# Patient Record
Sex: Female | Born: 1940 | Race: White | Hispanic: No
Health system: Southern US, Community
[De-identification: ages and names within clinical notes are randomized; demographics above are authoritative.]

## PROBLEM LIST (undated history)

## (undated) DIAGNOSIS — C801 Malignant (primary) neoplasm, unspecified: Secondary | ICD-10-CM

---

## 1999-07-23 HISTORY — PX: BREAST EXCISIONAL BIOPSY: SUR124

## 2002-03-29 ENCOUNTER — Ambulatory Visit (HOSPITAL_BASED_OUTPATIENT_CLINIC_OR_DEPARTMENT_OTHER): Admission: RE | Admit: 2002-03-29 | Discharge: 2002-03-29 | Payer: Self-pay | Admitting: Oral Surgery

## 2004-06-08 ENCOUNTER — Ambulatory Visit: Payer: Self-pay | Admitting: Family Medicine

## 2005-06-25 ENCOUNTER — Ambulatory Visit: Payer: Self-pay | Admitting: Family Medicine

## 2006-01-08 ENCOUNTER — Ambulatory Visit: Payer: Self-pay | Admitting: General Practice

## 2006-07-03 ENCOUNTER — Ambulatory Visit: Payer: Self-pay | Admitting: Family Medicine

## 2007-07-06 ENCOUNTER — Ambulatory Visit: Payer: Self-pay | Admitting: Family Medicine

## 2008-07-07 ENCOUNTER — Ambulatory Visit: Payer: Self-pay | Admitting: Family Medicine

## 2009-07-13 ENCOUNTER — Ambulatory Visit: Payer: Self-pay | Admitting: Family Medicine

## 2010-07-19 ENCOUNTER — Ambulatory Visit: Payer: Self-pay | Admitting: Family Medicine

## 2010-08-10 ENCOUNTER — Ambulatory Visit: Payer: Self-pay | Admitting: General Practice

## 2010-09-24 ENCOUNTER — Encounter: Payer: Self-pay | Admitting: General Practice

## 2010-10-21 ENCOUNTER — Encounter: Payer: Self-pay | Admitting: General Practice

## 2011-07-23 HISTORY — PX: REDUCTION MAMMAPLASTY: SUR839

## 2011-08-30 ENCOUNTER — Ambulatory Visit: Payer: Self-pay | Admitting: Family Medicine

## 2013-07-08 ENCOUNTER — Ambulatory Visit: Payer: Self-pay | Admitting: Family Medicine

## 2014-07-27 ENCOUNTER — Ambulatory Visit: Payer: Self-pay | Admitting: Family Medicine

## 2014-08-03 DIAGNOSIS — C439 Malignant melanoma of skin, unspecified: Secondary | ICD-10-CM

## 2014-08-03 HISTORY — DX: Malignant melanoma of skin, unspecified: C43.9

## 2016-02-07 ENCOUNTER — Other Ambulatory Visit: Payer: Self-pay | Admitting: Family Medicine

## 2016-02-07 DIAGNOSIS — Z1231 Encounter for screening mammogram for malignant neoplasm of breast: Secondary | ICD-10-CM

## 2016-02-29 ENCOUNTER — Ambulatory Visit: Payer: Self-pay

## 2016-03-28 ENCOUNTER — Ambulatory Visit
Admission: RE | Admit: 2016-03-28 | Discharge: 2016-03-28 | Disposition: A | Discharge status: Home / Self Care | Payer: Medicare Other | Source: Ambulatory Visit | Attending: Family Medicine | Admitting: Family Medicine

## 2016-03-28 DIAGNOSIS — Z1231 Encounter for screening mammogram for malignant neoplasm of breast: Secondary | ICD-10-CM | POA: Diagnosis present

## 2016-03-28 HISTORY — DX: Malignant (primary) neoplasm, unspecified: C80.1

## 2017-07-31 ENCOUNTER — Other Ambulatory Visit: Payer: Self-pay | Admitting: Family Medicine

## 2017-07-31 DIAGNOSIS — Z1231 Encounter for screening mammogram for malignant neoplasm of breast: Secondary | ICD-10-CM

## 2017-08-01 ENCOUNTER — Other Ambulatory Visit: Payer: Self-pay | Admitting: Family Medicine

## 2017-08-01 DIAGNOSIS — Z780 Asymptomatic menopausal state: Secondary | ICD-10-CM

## 2017-08-13 ENCOUNTER — Ambulatory Visit
Admission: RE | Admit: 2017-08-13 | Discharge: 2017-08-13 | Disposition: A | Discharge status: Home / Self Care | Payer: Medicare Other | Source: Ambulatory Visit | Attending: Family Medicine | Admitting: Family Medicine

## 2017-08-13 DIAGNOSIS — Z1231 Encounter for screening mammogram for malignant neoplasm of breast: Secondary | ICD-10-CM

## 2017-08-20 ENCOUNTER — Ambulatory Visit
Admission: RE | Admit: 2017-08-20 | Discharge: 2017-08-20 | Disposition: A | Discharge status: Home / Self Care | Payer: Medicare Other | Source: Ambulatory Visit | Attending: Family Medicine | Admitting: Family Medicine

## 2017-08-20 DIAGNOSIS — Z780 Asymptomatic menopausal state: Secondary | ICD-10-CM | POA: Diagnosis present

## 2017-08-20 DIAGNOSIS — M8588 Other specified disorders of bone density and structure, other site: Secondary | ICD-10-CM | POA: Diagnosis not present

## 2018-03-30 DIAGNOSIS — L570 Actinic keratosis: Secondary | ICD-10-CM

## 2018-03-30 HISTORY — DX: Actinic keratosis: L57.0

## 2019-09-28 ENCOUNTER — Other Ambulatory Visit: Payer: Self-pay

## 2019-09-28 DIAGNOSIS — L400 Psoriasis vulgaris: Secondary | ICD-10-CM

## 2019-10-04 ENCOUNTER — Ambulatory Visit (INDEPENDENT_AMBULATORY_CARE_PROVIDER_SITE_OTHER): Payer: Medicare Other

## 2019-10-04 ENCOUNTER — Other Ambulatory Visit: Payer: Self-pay

## 2019-10-04 DIAGNOSIS — L400 Psoriasis vulgaris: Secondary | ICD-10-CM

## 2019-10-04 NOTE — Progress Notes (Signed)
Total Surface Area: 164cm2 Total Energy: 172.36J

## 2019-10-11 ENCOUNTER — Ambulatory Visit (INDEPENDENT_AMBULATORY_CARE_PROVIDER_SITE_OTHER): Payer: Medicare Other

## 2019-10-11 ENCOUNTER — Other Ambulatory Visit: Payer: Self-pay

## 2019-10-11 DIAGNOSIS — L400 Psoriasis vulgaris: Secondary | ICD-10-CM | POA: Diagnosis not present

## 2019-10-11 NOTE — Progress Notes (Signed)
Total Surface Area: 180cm2 Total Energy: 217.62J

## 2019-11-15 ENCOUNTER — Other Ambulatory Visit: Payer: Self-pay

## 2019-11-15 DIAGNOSIS — Z1231 Encounter for screening mammogram for malignant neoplasm of breast: Secondary | ICD-10-CM

## 2019-11-17 ENCOUNTER — Ambulatory Visit
Admission: RE | Admit: 2019-11-17 | Discharge: 2019-11-17 | Disposition: A | Discharge status: Home / Self Care | Payer: Medicare Other | Source: Ambulatory Visit

## 2019-11-17 DIAGNOSIS — Z1231 Encounter for screening mammogram for malignant neoplasm of breast: Secondary | ICD-10-CM

## 2019-12-24 ENCOUNTER — Other Ambulatory Visit: Payer: Self-pay | Admitting: Dermatology

## 2019-12-24 DIAGNOSIS — L400 Psoriasis vulgaris: Secondary | ICD-10-CM

## 2020-01-17 ENCOUNTER — Encounter: Payer: Self-pay | Admitting: Dermatology

## 2020-01-17 ENCOUNTER — Ambulatory Visit (INDEPENDENT_AMBULATORY_CARE_PROVIDER_SITE_OTHER): Payer: Medicare Other | Admitting: Dermatology

## 2020-01-17 ENCOUNTER — Other Ambulatory Visit: Payer: Self-pay

## 2020-01-17 DIAGNOSIS — L409 Psoriasis, unspecified: Secondary | ICD-10-CM

## 2020-01-17 DIAGNOSIS — D229 Melanocytic nevi, unspecified: Secondary | ICD-10-CM

## 2020-01-17 DIAGNOSIS — L578 Other skin changes due to chronic exposure to nonionizing radiation: Secondary | ICD-10-CM

## 2020-01-17 DIAGNOSIS — Z1283 Encounter for screening for malignant neoplasm of skin: Secondary | ICD-10-CM

## 2020-01-17 DIAGNOSIS — Z85820 Personal history of malignant melanoma of skin: Secondary | ICD-10-CM

## 2020-01-17 DIAGNOSIS — D1800 Hemangioma unspecified site: Secondary | ICD-10-CM

## 2020-01-17 DIAGNOSIS — L821 Other seborrheic keratosis: Secondary | ICD-10-CM

## 2020-01-17 DIAGNOSIS — L814 Other melanin hyperpigmentation: Secondary | ICD-10-CM

## 2020-01-17 MED ORDER — MOMETASONE FUROATE 0.1 % EX SOLN: Freq: Every day | CUTANEOUS | 6 refills | Status: DC

## 2020-01-17 MED ORDER — MOMETASONE FUROATE 0.1 % EX CREA: TOPICAL_CREAM | Freq: Every day | CUTANEOUS | 6 refills | Status: DC

## 2020-01-17 NOTE — Progress Notes (Signed)
   Follow-Up Visit   Subjective  Lori Dalton is a 79 y.o. female who presents for the following: Annual Exam (patient hasn't noticed any new or changing moles, lesions, or spots) and psoriasis (patient's psoriasis continues to flare even with Mometasone solution and cream). The patient presents for Total-Body Skin Exam (TBSE) for skin cancer screening and mole check.  The following portions of the chart were reviewed this encounter and updated as appropriate:  Allergies  Meds  Problems  Med Hx  Surg Hx  Fam Hx      Review of Systems:  No other skin or systemic complaints except as noted in HPI or Assessment and Plan.  Objective  Well appearing patient in no apparent distress; mood and affect are within normal limits.  A full examination was performed including scalp, head, eyes, ears, nose, lips, neck, chest, axillae, abdomen, back, buttocks, bilateral upper extremities, bilateral lower extremities, hands, feet, fingers, toes, fingernails, and toenails. All findings within normal limits unless otherwise noted below.  Objective  B/L lower leg: Plaques and guttate plaques of the lower legs, back, and buttocks. L knee with a 3.0 cm plaque.   Assessment & Plan    Psoriasis B/L lower leg  Continue Mometasone cream and solution to aa's QD-BID PRN flares. ILK injection performed today into the left knee plaque 2.5 mg/mL, TV 0.5 cc, # of injections 6, lot # [id number], Exp. Date 02/2021  Skin cancer screening   Lentigines - Scattered tan macules - Discussed due to sun exposure - Benign, observe - Call for any changes  Seborrheic Keratoses - Stuck-on, waxy, tan-brown papules and plaques  - Discussed benign etiology and prognosis. - Observe - Call for any changes  Melanocytic Nevi - Tan-brown and/or pink-flesh-colored symmetric macules and papules - Benign appearing on exam today - Observation - Call clinic for new or changing moles - Recommend daily use of broad  spectrum spf 30+ sunscreen to sun-exposed areas.   Hemangiomas - Red papules - Discussed benign nature - Observe - Call for any changes  Actinic Damage - diffuse scaly erythematous macules with underlying dyspigmentation - Recommend daily broad spectrum sunscreen SPF 30+ to sun-exposed areas, reapply every 2 hours as needed.  - Call for new or changing lesions.  History of Melanoma - No evidence of recurrence today - No lymphadenopathy - Recommend regular full body skin exams - Recommend daily broad spectrum sunscreen SPF 30+ to sun-exposed areas, reapply every 2 hours as needed.  - Call if any new or changing lesions are noted between office visits   Skin cancer screening performed today.   Return in about 6 months (around 07/18/2020) for follow up psoriasis; TBSE in 1 year.  Luther Redo, CMA, am acting as scribe for Sarina Ser, MD .  Documentation: I have reviewed the above documentation for accuracy and completeness, and I agree with the above.  Sarina Ser, MD

## 2020-01-20 ENCOUNTER — Encounter: Payer: Self-pay | Admitting: Dermatology

## 2020-05-07 IMAGING — MG DIGITAL SCREENING BILAT W/ TOMO W/ CAD
8 series · 9 of 24 positions shown · non-contrast
Comparison: Previous exam(s).

CLINICAL DATA: Screening.

EXAM:
DIGITAL SCREENING BILATERAL MAMMOGRAM WITH TOMO AND CAD

[L CC synth-2D]
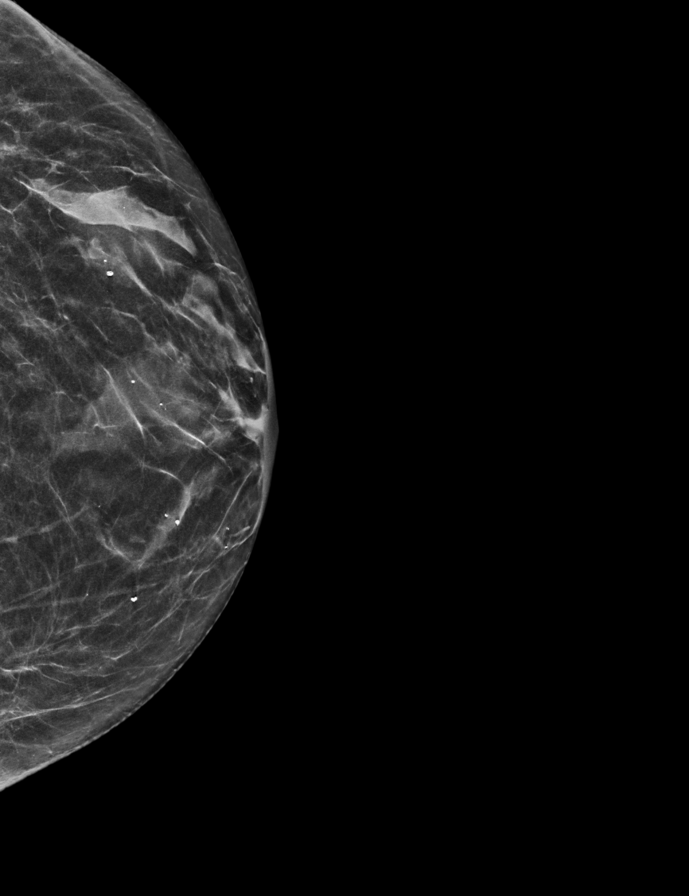

[R CC synth-2D]
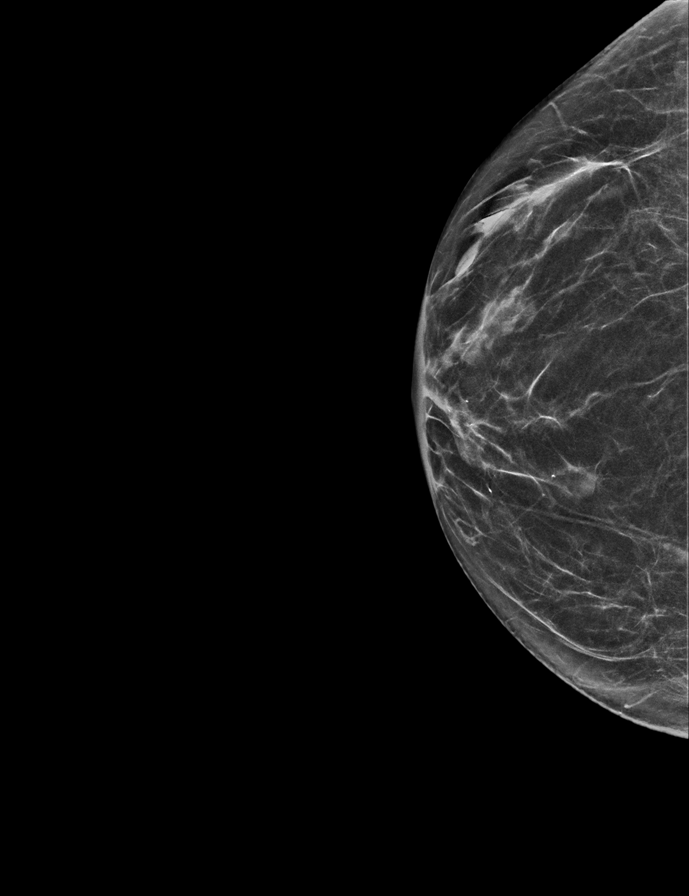

[L MLO synth-2D]
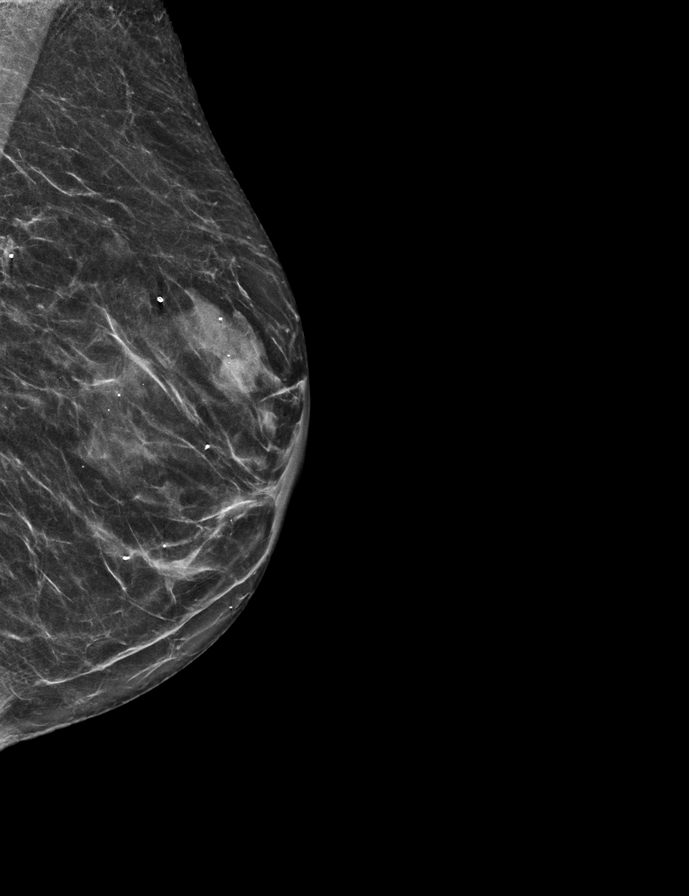

[R MLO synth-2D]
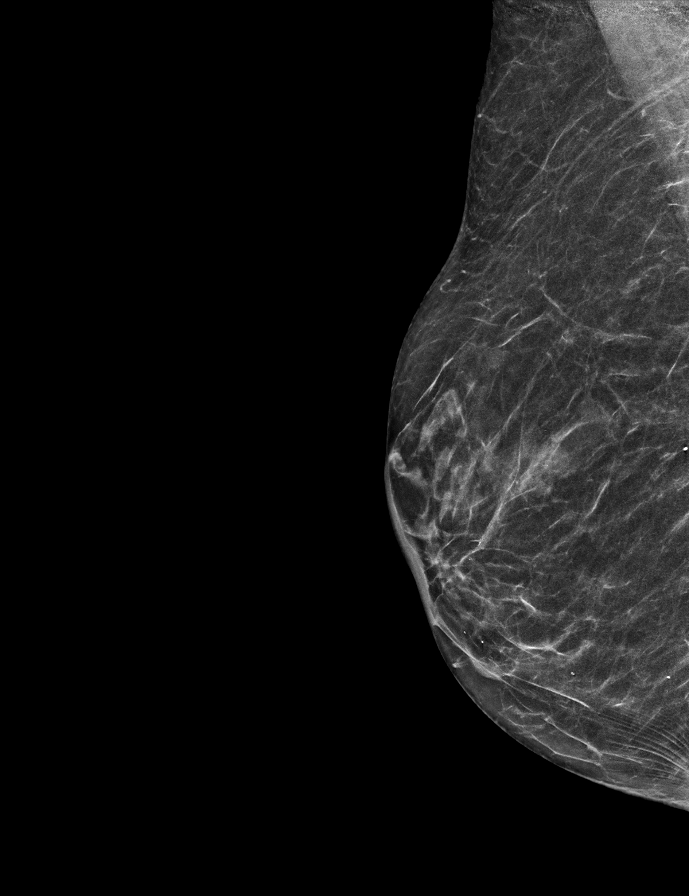

[R CC tomo · 2 of 51 frames shown]
[frame 17/51]
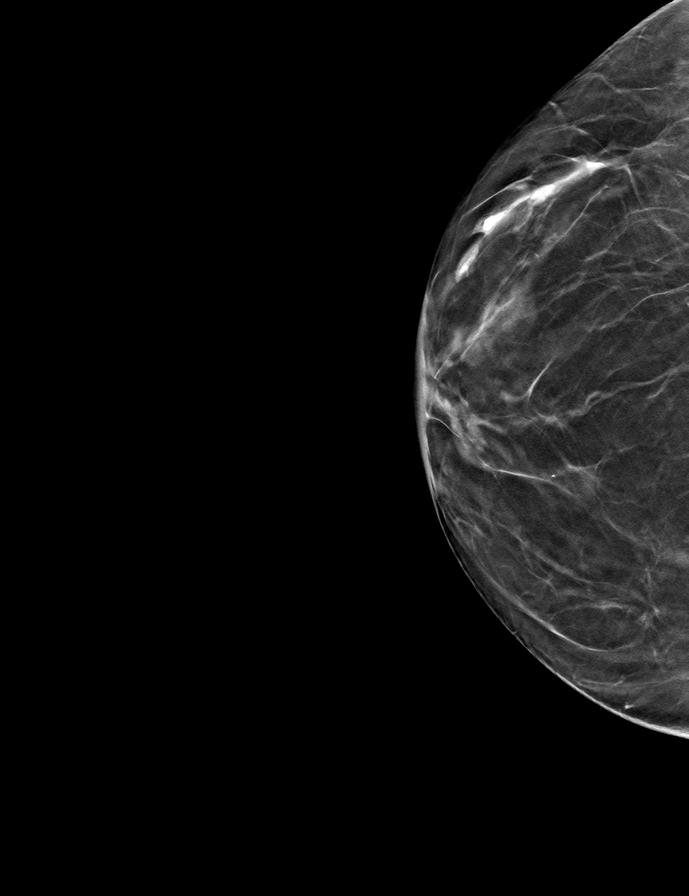
[frame 26/51]
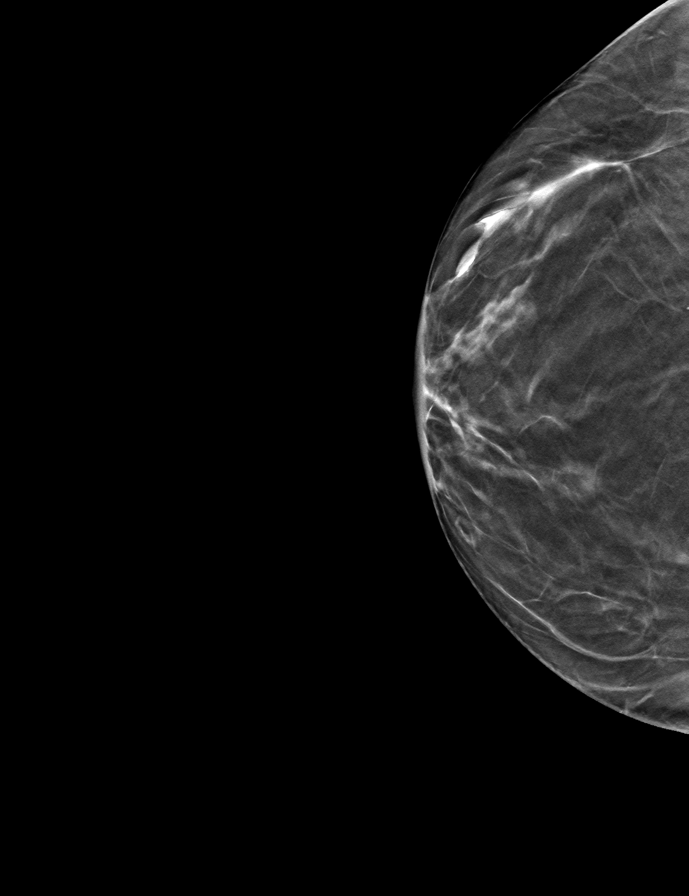

[L CC tomo · tomo slice 26/51.0]
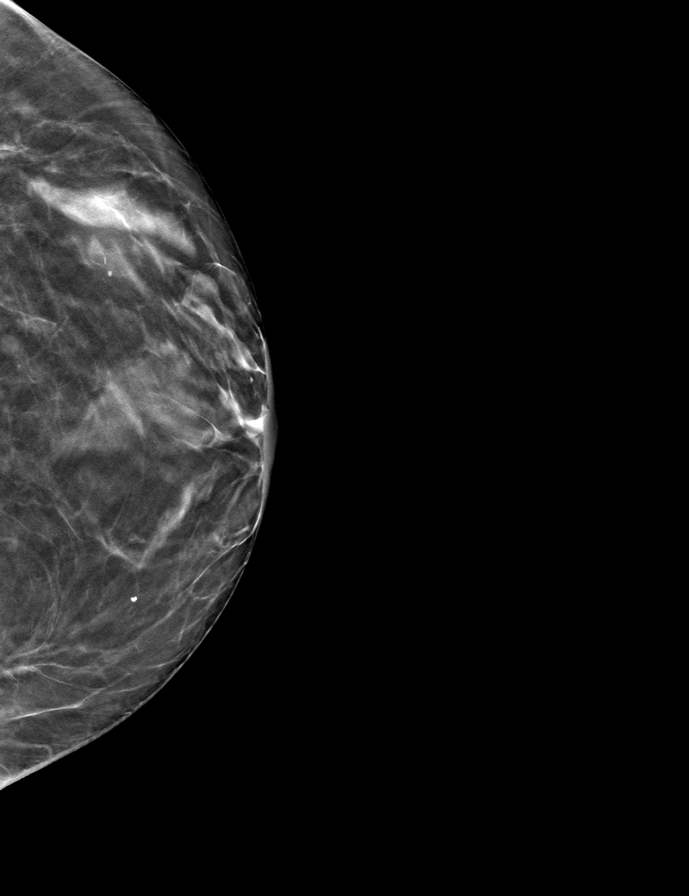

[L MLO tomo · tomo slice 27/54.0]
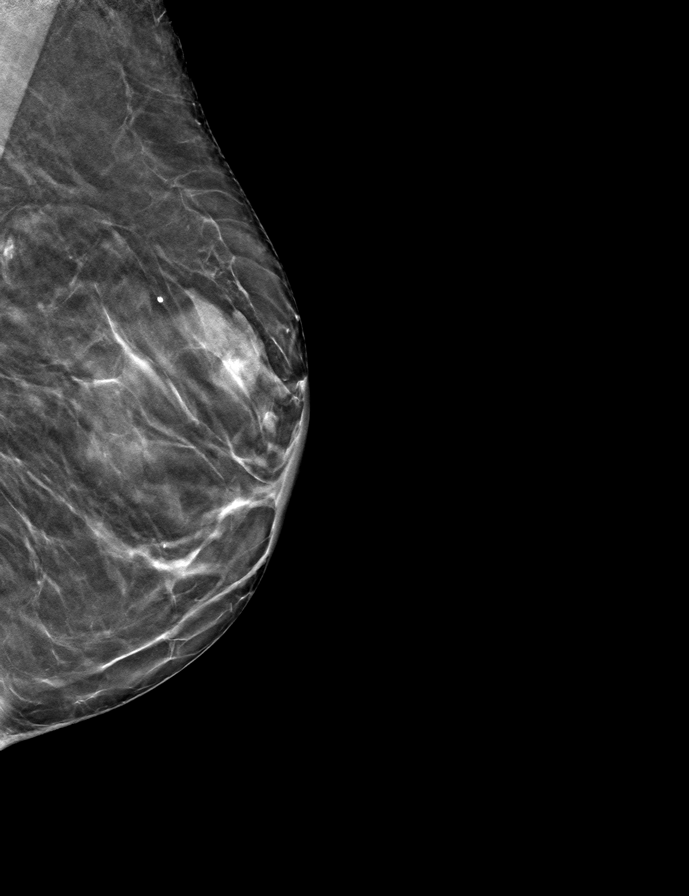

[R MLO tomo · tomo slice 26/51.0]
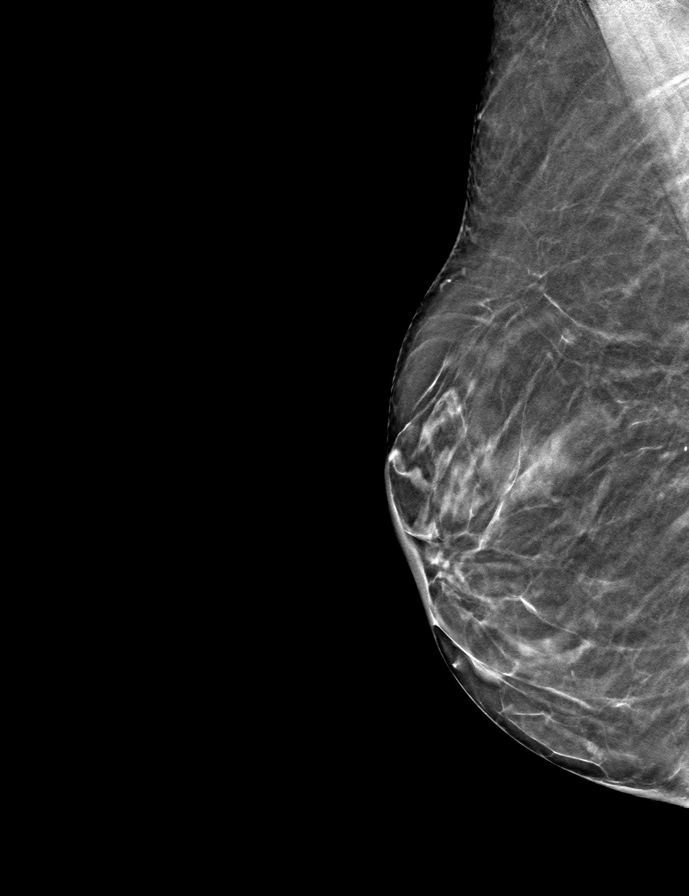

[9 of 24 positions shown; findings below may reference images not displayed]

ACR Breast Density Category c: The breast tissue is heterogeneously
dense, which may obscure small masses.
FINDINGS: There are no findings suspicious for malignancy. Images were
processed with CAD.
IMPRESSION: No mammographic evidence of malignancy. A result letter of this
screening mammogram will be mailed directly to the patient.

RECOMMENDATION:
Screening mammogram in one year. (Code:[id number])

BI-RADS CATEGORY  1: Negative.

## 2020-05-22 ENCOUNTER — Ambulatory Visit (INDEPENDENT_AMBULATORY_CARE_PROVIDER_SITE_OTHER): Payer: Medicare Other

## 2020-05-22 ENCOUNTER — Other Ambulatory Visit: Payer: Self-pay

## 2020-05-22 DIAGNOSIS — L400 Psoriasis vulgaris: Secondary | ICD-10-CM | POA: Diagnosis not present

## 2020-05-22 MED ORDER — CALCIPOTRIENE 0.005 % EX CREA: TOPICAL_CREAM | Freq: Two times a day (BID) | CUTANEOUS | 0 refills | Status: DC

## 2020-05-22 NOTE — Progress Notes (Signed)
Calcipotriene Cream RF.

## 2020-05-22 NOTE — Progress Notes (Signed)
Total Surface Area: 440cm2 Total Energy: 176.00J

## 2020-05-29 ENCOUNTER — Other Ambulatory Visit: Payer: Self-pay

## 2020-05-29 ENCOUNTER — Ambulatory Visit (INDEPENDENT_AMBULATORY_CARE_PROVIDER_SITE_OTHER): Payer: Medicare Other

## 2020-05-29 DIAGNOSIS — L400 Psoriasis vulgaris: Secondary | ICD-10-CM | POA: Diagnosis not present

## 2020-05-29 NOTE — Progress Notes (Signed)
Total Surface Area: 460cm2 Total Energy: 211.60J

## 2020-06-05 ENCOUNTER — Ambulatory Visit (INDEPENDENT_AMBULATORY_CARE_PROVIDER_SITE_OTHER): Payer: Medicare Other

## 2020-06-05 ENCOUNTER — Other Ambulatory Visit: Payer: Self-pay

## 2020-06-05 DIAGNOSIS — L400 Psoriasis vulgaris: Secondary | ICD-10-CM | POA: Diagnosis not present

## 2020-06-05 NOTE — Progress Notes (Signed)
Total Surface Area: 300cm2 Total Energy: 158.70J

## 2020-06-12 ENCOUNTER — Other Ambulatory Visit: Payer: Self-pay

## 2020-06-12 ENCOUNTER — Ambulatory Visit (INDEPENDENT_AMBULATORY_CARE_PROVIDER_SITE_OTHER): Payer: Medicare Other

## 2020-06-12 DIAGNOSIS — L400 Psoriasis vulgaris: Secondary | ICD-10-CM

## 2020-06-12 NOTE — Progress Notes (Signed)
Total Surface Area: 300cm2 Total Energy: 168.08J  Area under breasts also treated with 219mJ/cm2.

## 2020-06-19 ENCOUNTER — Other Ambulatory Visit: Payer: Self-pay

## 2020-06-19 ENCOUNTER — Ambulatory Visit (INDEPENDENT_AMBULATORY_CARE_PROVIDER_SITE_OTHER): Payer: Medicare Other

## 2020-06-19 DIAGNOSIS — L400 Psoriasis vulgaris: Secondary | ICD-10-CM | POA: Diagnosis not present

## 2020-06-19 NOTE — Progress Notes (Signed)
Total Surface Area: 332cm2 Total Energy: 201.86J

## 2020-06-26 ENCOUNTER — Other Ambulatory Visit: Payer: Self-pay

## 2020-06-26 ENCOUNTER — Ambulatory Visit (INDEPENDENT_AMBULATORY_CARE_PROVIDER_SITE_OTHER): Payer: Medicare Other

## 2020-06-26 DIAGNOSIS — L400 Psoriasis vulgaris: Secondary | ICD-10-CM

## 2020-06-26 NOTE — Progress Notes (Signed)
Total Surface Area: 320cm2 Total Energy: 194.56J

## 2020-07-03 ENCOUNTER — Other Ambulatory Visit: Payer: Self-pay

## 2020-07-03 ENCOUNTER — Ambulatory Visit (INDEPENDENT_AMBULATORY_CARE_PROVIDER_SITE_OTHER): Payer: Medicare Other

## 2020-07-03 DIAGNOSIS — L400 Psoriasis vulgaris: Secondary | ICD-10-CM

## 2020-07-03 NOTE — Progress Notes (Signed)
Total Surface Area: 300cm2 Total Energy: 193.73J

## 2020-07-10 ENCOUNTER — Ambulatory Visit (INDEPENDENT_AMBULATORY_CARE_PROVIDER_SITE_OTHER): Payer: Medicare Other

## 2020-07-10 ENCOUNTER — Other Ambulatory Visit: Payer: Self-pay

## 2020-07-10 DIAGNOSIS — L400 Psoriasis vulgaris: Secondary | ICD-10-CM

## 2020-07-10 NOTE — Progress Notes (Signed)
Total Surface Area: 272cm2 Total Energy: 190.13J

## 2020-07-25 ENCOUNTER — Ambulatory Visit: Payer: Medicare Other

## 2020-08-14 ENCOUNTER — Encounter: Payer: Self-pay | Admitting: Dermatology

## 2020-08-14 ENCOUNTER — Other Ambulatory Visit: Payer: Self-pay

## 2020-08-14 ENCOUNTER — Ambulatory Visit (INDEPENDENT_AMBULATORY_CARE_PROVIDER_SITE_OTHER): Payer: Medicare Other | Admitting: Dermatology

## 2020-08-14 ENCOUNTER — Ambulatory Visit (INDEPENDENT_AMBULATORY_CARE_PROVIDER_SITE_OTHER): Payer: Medicare Other

## 2020-08-14 DIAGNOSIS — L400 Psoriasis vulgaris: Secondary | ICD-10-CM | POA: Diagnosis not present

## 2020-08-14 DIAGNOSIS — L578 Other skin changes due to chronic exposure to nonionizing radiation: Secondary | ICD-10-CM

## 2020-08-14 DIAGNOSIS — C44729 Squamous cell carcinoma of skin of left lower limb, including hip: Secondary | ICD-10-CM | POA: Diagnosis not present

## 2020-08-14 DIAGNOSIS — L409 Psoriasis, unspecified: Secondary | ICD-10-CM

## 2020-08-14 DIAGNOSIS — C4492 Squamous cell carcinoma of skin, unspecified: Secondary | ICD-10-CM

## 2020-08-14 DIAGNOSIS — D485 Neoplasm of uncertain behavior of skin: Secondary | ICD-10-CM

## 2020-08-14 HISTORY — DX: Squamous cell carcinoma of skin, unspecified: C44.92

## 2020-08-14 NOTE — Progress Notes (Signed)
Follow-Up Visit   Subjective  Lori Dalton is a 80 y.o. female who presents for the following: Psoriasis (Of the lower legs and trunk - currently using Mometasone solution and cream, as well as Calcipotriene cream QD-BID, but she continues to flare). Patient also has a lesion on her left lower leg that is painful and will not heal. She is unsure whether or not it is a psoriasis plaque.   The following portions of the chart were reviewed this encounter and updated as appropriate:   Allergies  Meds  Problems  Med Hx  Surg Hx  Fam Hx     Review of Systems:  No other skin or systemic complaints except as noted in HPI or Assessment and Plan.  Objective  Well appearing patient in no apparent distress; mood and affect are within normal limits.  A focused examination was performed including the lower legs. Relevant physical exam findings are noted in the Assessment and Plan.  Objective  L mid lat pretibial: 1.5 cm Hyperkeratotic papule   Assessment & Plan  Psoriasis - flared trunk, lower legs  Psoriasis is a chronic non-curable, but treatable genetic/hereditary disease that may have other systemic features affecting other organ systems such as joints (Psoriatic Arthritis). It is associated with an increased risk of inflammatory bowel disease, heart disease, non-alcoholic fatty liver disease, and depression.    Continue Xtrac treatments.  Continue Mometasone cream and solution QD-BID Continue Calcipotriene  0.05% cream to aa's QD-BID   Discussed Otezla. Side effects of Otezla (apremilast) include diarrhea, nausea, headache, upper respiratory infection, depression, and weight decrease (5-10%). It should only be taken by pregnant women after a discussion regarding risks and benefits with their doctor. Goal is control of skin condition, not cure.  The use of [name] requires long term medication management, including periodic office visits.  Start [employer] - Starter pack given patient to  titrate to 30mg  po QD  Neoplasm of uncertain behavior of skin L mid lat pretibial  Epidermal / dermal shaving  Lesion diameter (cm):  1.5 Informed consent: discussed and consent obtained   Timeout: patient name, date of birth, surgical site, and procedure verified   Procedure prep:  Patient was prepped and draped in usual sterile fashion Prep type:  Isopropyl alcohol Anesthesia: the lesion was anesthetized in a standard fashion   Anesthetic:  1% lidocaine w/ epinephrine 1-100,000 buffered w/ 8.4% NaHCO3 Instrument used: flexible razor blade   Hemostasis achieved with: pressure, aluminum chloride and electrodesiccation   Outcome: patient tolerated procedure well   Post-procedure details: sterile dressing applied and wound care instructions given   Dressing type: bandage and petrolatum    Destruction of lesion Complexity: extensive   Destruction method: electrodesiccation and curettage   Informed consent: discussed and consent obtained   Timeout:  patient name, date of birth, surgical site, and procedure verified Procedure prep:  Patient was prepped and draped in usual sterile fashion Prep type:  Isopropyl alcohol Anesthesia: the lesion was anesthetized in a standard fashion   Anesthetic:  1% lidocaine w/ epinephrine 1-100,000 buffered w/ 8.4% NaHCO3 Curettage performed in three different directions: Yes   Electrodesiccation performed over the curetted area: Yes   Lesion length (cm):  1.5 Lesion width (cm):  1.5 Margin per side (cm):  0.2 Final wound size (cm):  1.9 Hemostasis achieved with:  pressure, aluminum chloride and electrodesiccation Outcome: patient tolerated procedure well with no complications   Post-procedure details: sterile dressing applied and wound care instructions given  Dressing type: bandage and petrolatum    Specimen 1 - Surgical pathology Differential Diagnosis: D48.5 r/o SCC  ED&C today  Check Margins: No 1.5 cm Hyperkeratotic papule  Actinic  Damage - chronic, secondary to cumulative UV radiation exposure/sun exposure over time - diffuse scaly erythematous macules with underlying dyspigmentation - Recommend daily broad spectrum sunscreen SPF 30+ to sun-exposed areas, reapply every 2 hours as needed.  - Call for new or changing lesions.  Return in about 1 month (around 09/14/2020) for [place] f/u .  Luther Redo, CMA, am acting as scribe for Sarina Ser, MD .  Documentation: I have reviewed the above documentation for accuracy and completeness, and I agree with the above.  Sarina Ser, MD

## 2020-08-14 NOTE — Patient Instructions (Signed)

## 2020-08-14 NOTE — Progress Notes (Signed)
Total Surface Area: 168cm2 Total Energy: 123.31J

## 2020-08-15 ENCOUNTER — Encounter: Payer: Self-pay | Admitting: Dermatology

## 2020-08-21 ENCOUNTER — Ambulatory Visit (INDEPENDENT_AMBULATORY_CARE_PROVIDER_SITE_OTHER): Payer: Medicare Other

## 2020-08-21 ENCOUNTER — Other Ambulatory Visit: Payer: Self-pay

## 2020-08-21 DIAGNOSIS — L400 Psoriasis vulgaris: Secondary | ICD-10-CM

## 2020-08-21 NOTE — Progress Notes (Signed)
Total Surface Area: 84cm2 Total Energy: 58.55J

## 2020-08-22 ENCOUNTER — Telehealth: Payer: Self-pay

## 2020-08-22 NOTE — Telephone Encounter (Signed)
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

## 2020-08-22 NOTE — Telephone Encounter (Signed)
Went over results with pt at xtrac appt on 08/21/20. She had no concerns.

## 2020-08-28 ENCOUNTER — Ambulatory Visit (INDEPENDENT_AMBULATORY_CARE_PROVIDER_SITE_OTHER): Payer: Medicare Other

## 2020-08-28 ENCOUNTER — Other Ambulatory Visit: Payer: Self-pay

## 2020-08-28 DIAGNOSIS — L400 Psoriasis vulgaris: Secondary | ICD-10-CM | POA: Diagnosis not present

## 2020-08-28 NOTE — Progress Notes (Signed)
Total Surface Area: 136cm2 Total Energy: 94.79J

## 2020-09-11 ENCOUNTER — Other Ambulatory Visit: Payer: Self-pay

## 2020-09-11 ENCOUNTER — Ambulatory Visit (INDEPENDENT_AMBULATORY_CARE_PROVIDER_SITE_OTHER): Payer: Medicare Other

## 2020-09-11 DIAGNOSIS — L400 Psoriasis vulgaris: Secondary | ICD-10-CM | POA: Diagnosis not present

## 2020-09-11 NOTE — Progress Notes (Signed)
Total Surface Area: 64cm2 Total Energy: 44.61J

## 2020-09-18 ENCOUNTER — Ambulatory Visit (INDEPENDENT_AMBULATORY_CARE_PROVIDER_SITE_OTHER): Payer: Medicare Other

## 2020-09-18 ENCOUNTER — Ambulatory Visit (INDEPENDENT_AMBULATORY_CARE_PROVIDER_SITE_OTHER): Payer: Medicare Other | Admitting: Dermatology

## 2020-09-18 ENCOUNTER — Other Ambulatory Visit: Payer: Self-pay

## 2020-09-18 DIAGNOSIS — L578 Other skin changes due to chronic exposure to nonionizing radiation: Secondary | ICD-10-CM

## 2020-09-18 DIAGNOSIS — L400 Psoriasis vulgaris: Secondary | ICD-10-CM

## 2020-09-18 DIAGNOSIS — L409 Psoriasis, unspecified: Secondary | ICD-10-CM

## 2020-09-18 MED ORDER — CALCIPOTRIENE 0.005 % EX CREA: TOPICAL_CREAM | Freq: Two times a day (BID) | CUTANEOUS | 6 refills | Status: DC

## 2020-09-18 MED ORDER — MOMETASONE FUROATE 0.1 % EX SOLN: Freq: Every day | CUTANEOUS | 6 refills | Status: DC

## 2020-09-18 NOTE — Patient Instructions (Signed)
Topical steroids (such as triamcinolone, fluocinolone, fluocinonide, mometasone, clobetasol, halobetasol, betamethasone, hydrocortisone) can cause thinning and lightening of the skin if they are used for too long in the same area. Your physician has selected the right strength medicine for your problem and area affected on the body. Please use your medication only as directed by your physician to prevent side effects.   Side effects of Otezla (apremilast) include diarrhea, nausea, headache, upper respiratory infection, depression, and weight decrease (5-10%). It should only be taken by pregnant women after a discussion regarding risks and benefits with their doctor. Goal is control of skin condition, not cure.  The use of [name] requires long term medication management, including periodic office visits.

## 2020-09-18 NOTE — Progress Notes (Signed)
   Follow-Up Visit   Subjective  Lori Dalton is a 80 y.o. female who presents for the following: Psoriasis (Patient here for follow up on psoriasis. She was given a starter pack for otezla. Patient reports no new outbreaks. She also reports since starting otezla she has been having headaches. She is still using mometasone cream and solution and continues xtrac treatments. ).  The following portions of the chart were reviewed this encounter and updated as appropriate:  Allergies  Meds  Problems  Med Hx  Surg Hx  Fam Hx      Objective  Well appearing patient in no apparent distress; mood and affect are within normal limits.  A focused examination was performed including scalp, legs, trunk, hands, abdomen, bilateral arms. Relevant physical exam findings are noted in the Assessment and Plan.  Objective  bilateral legs and trunk: Well-demarcated erythematous papules/plaques with silvery scale, guttate pink scaly papules.   Assessment & Plan  Psoriasis - persistent despite oral systemic Otezla; X-trac laser and topical Mometasone treatments. bilateral legs and trunk trunk, lower legs Psoriasis is a chronic non-curable, but treatable genetic/hereditary disease that may have other systemic features affecting other organ systems such as joints (Psoriatic Arthritis). It is associated with an increased risk of inflammatory bowel disease, heart disease, non-alcoholic fatty liver disease, and depression.    Patient reports some headaches every 3 or 4 days since starting ortezla.  Will follow up with patient in 6 weeks after patient finishes otezla starter pack will consider starting [employer] given in office   Discussed need for blood work before starting  [employer] pack  Continue Xtrac treatments.  Continue Mometasone cream and solution QD-BID 60 ml 6 rf Continue Calcipotriene  0.05% cream to aa's QD-BID 60 ml 6 rf   Side effects of Otezla (apremilast) include  diarrhea, nausea, headache, upper respiratory infection, depression, and weight decrease (5-10%). It should only be taken by pregnant women after a discussion regarding risks and benefits with their doctor. Goal is control of skin condition, not cure.  The use of [name] requires long term medication management, including periodic office visits.   Reordered Medications calcipotriene (DOVONOX) 0.005 % cream mometasone (ELOCON) 0.1 % lotion  Actinic Damage - chronic, secondary to cumulative UV radiation exposure/sun exposure over time - diffuse scaly erythematous macules with underlying dyspigmentation - Recommend daily broad spectrum sunscreen SPF 30+ to sun-exposed areas, reapply every 2 hours as needed.  - Call for new or changing lesions.  Return in about 6 weeks (around 10/30/2020) for psoriasis follow up .  IRuthell Rummage, CMA, am acting as scribe for Sarina Ser, MD.  Documentation: I have reviewed the above documentation for accuracy and completeness, and I agree with the above.  Sarina Ser, MD

## 2020-09-18 NOTE — Progress Notes (Signed)
Total Surface Area: 188cm2 Total Energy: 150.59J

## 2020-09-19 ENCOUNTER — Encounter: Payer: Self-pay | Admitting: Dermatology

## 2020-09-20 ENCOUNTER — Encounter: Payer: Self-pay | Admitting: Dermatology

## 2020-09-25 ENCOUNTER — Other Ambulatory Visit: Payer: Self-pay

## 2020-09-25 ENCOUNTER — Ambulatory Visit (INDEPENDENT_AMBULATORY_CARE_PROVIDER_SITE_OTHER): Payer: Medicare Other

## 2020-09-25 DIAGNOSIS — L400 Psoriasis vulgaris: Secondary | ICD-10-CM | POA: Diagnosis not present

## 2020-09-25 NOTE — Progress Notes (Signed)
Total Surface Area: 140cm2 Total Energy: 95.20

## 2020-10-09 ENCOUNTER — Ambulatory Visit (INDEPENDENT_AMBULATORY_CARE_PROVIDER_SITE_OTHER): Payer: Medicare Other

## 2020-10-09 ENCOUNTER — Other Ambulatory Visit: Payer: Self-pay

## 2020-10-09 DIAGNOSIS — L400 Psoriasis vulgaris: Secondary | ICD-10-CM

## 2020-10-09 NOTE — Progress Notes (Signed)
Total Surface Area: 196cm2 Total Energy: 133.28J

## 2020-10-16 ENCOUNTER — Other Ambulatory Visit: Payer: Self-pay

## 2020-10-16 ENCOUNTER — Ambulatory Visit: Payer: Medicare Other

## 2020-11-01 ENCOUNTER — Ambulatory Visit (INDEPENDENT_AMBULATORY_CARE_PROVIDER_SITE_OTHER): Payer: Medicare Other | Admitting: Dermatology

## 2020-11-01 ENCOUNTER — Other Ambulatory Visit: Payer: Self-pay

## 2020-11-01 ENCOUNTER — Ambulatory Visit (INDEPENDENT_AMBULATORY_CARE_PROVIDER_SITE_OTHER): Payer: Medicare Other

## 2020-11-01 DIAGNOSIS — L409 Psoriasis, unspecified: Secondary | ICD-10-CM

## 2020-11-01 DIAGNOSIS — D492 Neoplasm of unspecified behavior of bone, soft tissue, and skin: Secondary | ICD-10-CM

## 2020-11-01 DIAGNOSIS — L578 Other skin changes due to chronic exposure to nonionizing radiation: Secondary | ICD-10-CM | POA: Diagnosis not present

## 2020-11-01 DIAGNOSIS — L400 Psoriasis vulgaris: Secondary | ICD-10-CM

## 2020-11-01 DIAGNOSIS — C44729 Squamous cell carcinoma of skin of left lower limb, including hip: Secondary | ICD-10-CM | POA: Diagnosis not present

## 2020-11-01 NOTE — Patient Instructions (Addendum)
If you have any questions or concerns for your doctor, please call our main line at [phone] and press option 4 to reach your doctor's medical assistant. If no one answers, please leave a voicemail as directed and we will return your call as soon as possible. Messages left after 4 pm will be answered the following business day.   You may also send us a message via MyChart. We typically respond to MyChart messages within 1-2 business days.  For prescription refills, please ask your pharmacy to contact our office. Our fax number is [phone].  If you have an urgent issue when the clinic is closed that cannot wait until the next business day, you can page your doctor at the number below.    Please note that while we do our best to be available for urgent issues outside of office hours, we are not available 24/7.   If you have an urgent issue and are unable to reach us, you may choose to seek medical care at your doctor's office, retail clinic, urgent care center, or emergency room.  If you have a medical emergency, please immediately call 911 or go to the emergency department.  Pager Numbers  - Dr. Kowalski: [phone]  - Dr. Moye: [phone]  - Dr. Stewart: [phone]  In the event of inclement weather, please call our main line at [phone] for an update on the status of any delays or closures.  Dermatology Medication Tips: Please keep the boxes that topical medications come in in order to help keep track of the instructions about where and how to use these. Pharmacies typically print the medication instructions only on the boxes and not directly on the medication tubes.   If your medication is too expensive, please contact our office at [phone] option 4 or send us a message through MyChart.   We are unable to tell what your co-pay for medications will be in advance as this is different depending on your insurance coverage. However, we may be able to find a  substitute medication at lower cost or fill out paperwork to get insurance to cover a needed medication.   If a prior authorization is required to get your medication covered by your insurance company, please allow us 1-2 business days to complete this process.  Drug prices often vary depending on where the prescription is filled and some pharmacies may offer cheaper prices.  The website www.goodrx.com contains coupons for medications through different pharmacies. The prices here do not account for what the cost may be with help from insurance (it may be cheaper with your insurance), but the website can give you the price if you did not use any insurance.  - You can print the associated coupon and take it with your prescription to the pharmacy.  - You may also stop by our office during regular business hours and pick up a GoodRx coupon card.  - If you need your prescription sent electronically to a different pharmacy, notify our office through [facility] MyChart or by phone at [phone] option 4.     Wound Care Instructions  1. Cleanse wound gently with soap and water once a day then pat dry with clean gauze. Apply a thing coat of Petrolatum (petroleum jelly, "Vaseline") over the wound (unless you have an allergy to this). We recommend that you use a new, sterile tube of Vaseline. Do not pick or remove scabs. Do not remove the yellow or white "healing tissue" from the base of the wound.    2. Cover the wound with fresh, clean, nonstick gauze and secure with paper tape. You may use Band-Aids in place of gauze and tape if the would is small enough, but would recommend trimming much of the tape off as there is often too much. Sometimes Band-Aids can irritate the skin.  3. You should call the office for your biopsy report after 1 week if you have not already been contacted.  4. If you experience any problems, such as abnormal amounts of bleeding, swelling, significant bruising, significant pain,  or evidence of infection, please call the office immediately.  5. FOR ADULT SURGERY PATIENTS: If you need something for pain relief you may take 1 extra strength Tylenol (acetaminophen) AND 2 Ibuprofen (200mg each) together every 4 hours as needed for pain. (do not take these if you are allergic to them or if you have a reason you should not take them.) Typically, you may only need pain medication for 1 to 3 days.     

## 2020-11-01 NOTE — Progress Notes (Signed)
Follow-Up Visit   Subjective  Lori Dalton is a 80 y.o. female who presents for the following: Psoriasis (Leg, trunk, 6wk f/u improving, Xtrac, Mometasone, pt d/c [place] at last visit) and check new spots (L lower leg, 3wks, painful).  The following portions of the chart were reviewed this encounter and updated as appropriate:   Allergies  Meds  Problems  Med Hx  Surg Hx  Fam Hx     Review of Systems:  No other skin or systemic complaints except as noted in HPI or Assessment and Plan.  Objective  Well appearing patient in no apparent distress; mood and affect are within normal limits.  A focused examination was performed including legs. Relevant physical exam findings are noted in the Assessment and Plan.  Objective  Left lat calf - superior: 1.8 x 1.5cm hyperkeratotic pap  Objective  Left lat calf - inferior: 2.0 x 1.5cm hyperkeratotic pap  Objective  bil lower legs: Scaly pink paps bil lower legs   Assessment & Plan  Neoplasm of skin (2) Left lat calf - superior Epidermal / dermal shaving  Lesion diameter (cm):  1.8 Informed consent: discussed and consent obtained   Timeout: patient name, date of birth, surgical site, and procedure verified   Procedure prep:  Patient was prepped and draped in usual sterile fashion Prep type:  Isopropyl alcohol Anesthesia: the lesion was anesthetized in a standard fashion   Local anesthetic: 0.5% bupivicaine buffered w/ 8.4% NaHC03. Instrument used: flexible razor blade   Hemostasis achieved with: pressure, aluminum chloride and electrodesiccation   Outcome: patient tolerated procedure well   Post-procedure details: sterile dressing applied and wound care instructions given   Dressing type: bandage, bacitracin and pressure dressing    Destruction of lesion Complexity: extensive   Destruction method: electrodesiccation and curettage   Informed consent: discussed and consent obtained   Timeout:  patient name, date of  birth, surgical site, and procedure verified Procedure prep:  Patient was prepped and draped in usual sterile fashion Prep type:  Isopropyl alcohol Anesthesia: the lesion was anesthetized in a standard fashion   Anesthetic:  1% lidocaine w/ epinephrine 1-100,000 buffered w/ 8.4% NaHCO3 (05% bupivicaine buffered w/ 8.4% NaHC03) Curettage performed in three different directions: Yes   Electrodesiccation performed over the curetted area: Yes   Lesion length (cm):  1.8 Lesion width (cm):  1.5 Margin per side (cm):  0.2 Final wound size (cm):  2.2 Hemostasis achieved with:  pressure, aluminum chloride and electrodesiccation Outcome: patient tolerated procedure well with no complications   Post-procedure details: sterile dressing applied and wound care instructions given   Dressing type: bandage, bacitracin and pressure dressing    Specimen 1 - Surgical pathology Differential Diagnosis: D48.5 R/O SCC  Check Margins: No 1.5 x 1.5cm hyperkeratotic pap EDC today  Left lat calf - inferior Epidermal / dermal shaving  Lesion diameter (cm):  2 Informed consent: discussed and consent obtained   Timeout: patient name, date of birth, surgical site, and procedure verified   Procedure prep:  Patient was prepped and draped in usual sterile fashion Prep type:  Isopropyl alcohol Anesthesia: the lesion was anesthetized in a standard fashion   Anesthetic:  1% lidocaine w/ epinephrine 1-100,000 buffered w/ 8.4% NaHCO3 Instrument used: flexible razor blade   Hemostasis achieved with: pressure, aluminum chloride and electrodesiccation   Outcome: patient tolerated procedure well   Post-procedure details: sterile dressing applied and wound care instructions given   Dressing type: bandage, bacitracin and pressure dressing  Destruction of lesion Complexity: extensive   Destruction method: electrodesiccation and curettage   Informed consent: discussed and consent obtained   Timeout:  patient name, date  of birth, surgical site, and procedure verified Procedure prep:  Patient was prepped and draped in usual sterile fashion Prep type:  Isopropyl alcohol Anesthesia: the lesion was anesthetized in a standard fashion   Anesthetic:  1% lidocaine w/ epinephrine 1-100,000 buffered w/ 8.4% NaHCO3 Curettage performed in three different directions: Yes   Electrodesiccation performed over the curetted area: Yes   Lesion length (cm):  2 Lesion width (cm):  1.5 Margin per side (cm):  0.2 Final wound size (cm):  2.4 Hemostasis achieved with:  pressure, aluminum chloride and electrodesiccation Outcome: patient tolerated procedure well with no complications   Post-procedure details: sterile dressing applied and wound care instructions given   Dressing type: bandage and bacitracin    Specimen 2 - Surgical pathology Differential Diagnosis: D48.5 R/O SCC  Check Margins: No 2.0 x 1.5cm hyperkeratotic pap EDC today  Psoriasis bil lower legs Psoriasis is a chronic non-curable, but treatable genetic/hereditary disease that may have other systemic features affecting other organ systems such as joints (Psoriatic Arthritis). It is associated with an increased risk of inflammatory bowel disease, heart disease, non-alcoholic fatty liver disease, and depression.    Pt previously tried [place] and had headaches and felt psoriasis worsened. Discussed Skyri[place], pt declines  Cont Mometasone lotion qd prn flares Restart Calcipotriene cr qd prn flares  Restart Xtrac  Other Related Medications calcipotriene (DOVONOX) 0.005 % cream mometasone (ELOCON) 0.1 % lotion  Actinic Damage - chronic, secondary to cumulative UV radiation exposure/sun exposure over time - diffuse scaly erythematous macules with underlying dyspigmentation - Recommend daily broad spectrum sunscreen SPF 30+ to sun-exposed areas, reapply every 2 hours as needed.  - Recommend staying in the shade or wearing long sleeves, sun glasses (UVA+UVB  protection) and wide brim hats (4-inch brim around the entire circumference of the hat). - Call for new or changing lesions.  Return in about 6 months (around 05/03/2021) for Psoriasis f/u, Xtrac 2-3x/wk.  I, Othelia Pulling, RMA, am acting as scribe for Sarina Ser, MD .  Documentation: I have reviewed the above documentation for accuracy and completeness, and I agree with the above.  Sarina Ser, MD

## 2020-11-01 NOTE — Progress Notes (Signed)
Total Surface Area: 128cm2 Total Energy: 38.40J

## 2020-11-02 ENCOUNTER — Encounter: Payer: Self-pay | Admitting: Dermatology

## 2020-11-06 ENCOUNTER — Telehealth: Payer: Self-pay

## 2020-11-06 NOTE — Telephone Encounter (Signed)
Left message on voicemail to return my call.  

## 2020-11-06 NOTE — Telephone Encounter (Signed)
-----   Message from Ralene Bathe, MD sent at 11/02/2020  6:22 PM EDT ----- Diagnosis 1. 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

## 2020-11-24 ENCOUNTER — Other Ambulatory Visit: Payer: Self-pay

## 2020-11-24 DIAGNOSIS — Z1231 Encounter for screening mammogram for malignant neoplasm of breast: Secondary | ICD-10-CM

## 2020-12-01 ENCOUNTER — Other Ambulatory Visit: Payer: Self-pay

## 2020-12-01 ENCOUNTER — Ambulatory Visit
Admission: RE | Admit: 2020-12-01 | Discharge: 2020-12-01 | Disposition: A | Discharge status: Home / Self Care | Payer: Medicare Other | Source: Ambulatory Visit

## 2020-12-01 DIAGNOSIS — Z1231 Encounter for screening mammogram for malignant neoplasm of breast: Secondary | ICD-10-CM | POA: Diagnosis not present

## 2021-01-23 ENCOUNTER — Ambulatory Visit (INDEPENDENT_AMBULATORY_CARE_PROVIDER_SITE_OTHER): Payer: Medicare Other

## 2021-01-23 DIAGNOSIS — L400 Psoriasis vulgaris: Secondary | ICD-10-CM | POA: Diagnosis not present

## 2021-01-23 NOTE — Progress Notes (Signed)
Total Surface Area: 124cm2 Total Energy: 37.20J

## 2021-02-21 ENCOUNTER — Other Ambulatory Visit: Payer: Self-pay

## 2021-02-21 ENCOUNTER — Ambulatory Visit (INDEPENDENT_AMBULATORY_CARE_PROVIDER_SITE_OTHER): Payer: Medicare Other | Admitting: Dermatology

## 2021-02-21 ENCOUNTER — Encounter: Payer: Self-pay | Admitting: Dermatology

## 2021-02-21 ENCOUNTER — Ambulatory Visit (INDEPENDENT_AMBULATORY_CARE_PROVIDER_SITE_OTHER): Payer: Medicare Other

## 2021-02-21 ENCOUNTER — Telehealth: Payer: Self-pay

## 2021-02-21 DIAGNOSIS — D492 Neoplasm of unspecified behavior of bone, soft tissue, and skin: Secondary | ICD-10-CM

## 2021-02-21 DIAGNOSIS — L409 Psoriasis, unspecified: Secondary | ICD-10-CM | POA: Diagnosis not present

## 2021-02-21 DIAGNOSIS — C44729 Squamous cell carcinoma of skin of left lower limb, including hip: Secondary | ICD-10-CM

## 2021-02-21 DIAGNOSIS — L820 Inflamed seborrheic keratosis: Secondary | ICD-10-CM | POA: Diagnosis not present

## 2021-02-21 DIAGNOSIS — L400 Psoriasis vulgaris: Secondary | ICD-10-CM | POA: Diagnosis not present

## 2021-02-21 MED ORDER — VTAMA 1 % EX CREA
1.0000 | TOPICAL_CREAM | Freq: Every day | CUTANEOUS | 4 refills | Status: DC
Start: 2021-02-21 — End: 2021-12-10

## 2021-02-21 NOTE — Progress Notes (Signed)
Follow-Up Visit   Subjective  Lori Dalton is a 80 y.o. female who presents for the following: check spots legs (Bil legs, scaly). Patient has history of skin cancer.  She also has psoriasis currently using XTRAC laser and using topical treatment.  The following portions of the chart were reviewed this encounter and updated as appropriate:   Allergies  Meds  Problems  Med Hx  Surg Hx  Fam Hx      Review of Systems:  No other skin or systemic complaints except as noted in HPI or Assessment and Plan.  Objective  Well appearing patient in no apparent distress; mood and affect are within normal limits.  A focused examination was performed including bil legs. Relevant physical exam findings are noted in the Assessment and Plan.  L prox lat calf Hyperkeratotic pap 1.5cm  bil lower legs x 3, Total = 3 Erythematous keratotic or waxy stuck-on papule or plaque.   bil legs Guttate patches legs   Assessment & Plan  Neoplasm of skin L prox lat calf  Epidermal / dermal shaving  Lesion diameter (cm):  1.5 Informed consent: discussed and consent obtained   Timeout: patient name, date of birth, surgical site, and procedure verified   Procedure prep:  Patient was prepped and draped in usual sterile fashion Prep type:  Isopropyl alcohol Anesthesia: the lesion was anesthetized in a standard fashion   Anesthetic:  1% lidocaine w/ epinephrine 1-100,000 buffered w/ 8.4% NaHCO3 Instrument used: flexible razor blade   Hemostasis achieved with: pressure, aluminum chloride and electrodesiccation   Outcome: patient tolerated procedure well   Post-procedure details: sterile dressing applied and wound care instructions given   Dressing type: bandage and bacitracin    Destruction of lesion Complexity: extensive   Destruction method: electrodesiccation and curettage   Informed consent: discussed and consent obtained   Timeout:  patient name, date of birth, surgical site, and procedure  verified Procedure prep:  Patient was prepped and draped in usual sterile fashion Prep type:  Isopropyl alcohol Anesthesia: the lesion was anesthetized in a standard fashion   Anesthetic:  1% lidocaine w/ epinephrine 1-100,000 buffered w/ 8.4% NaHCO3 Curettage performed in three different directions: Yes   Electrodesiccation performed over the curetted area: Yes   Lesion length (cm):  1.5 Lesion width (cm):  1.5 Margin per side (cm):  0.2 Final wound size (cm):  1.9 Hemostasis achieved with:  pressure, aluminum chloride and electrodesiccation Outcome: patient tolerated procedure well with no complications   Post-procedure details: sterile dressing applied and wound care instructions given   Dressing type: bandage and bacitracin    Specimen 1 - Surgical pathology Differential Diagnosis: D48.5 R/O SCC  Check Margins: No Hyperkeratotic pap 1.5cm EDC today  Inflamed seborrheic keratosis bil lower legs x 3, Total = 3  Destruction of lesion - bil lower legs x 3, Total = 3 Complexity: simple   Destruction method: cryotherapy   Informed consent: discussed and consent obtained   Timeout:  patient name, date of birth, surgical site, and procedure verified Lesion destroyed using liquid nitrogen: Yes   Region frozen until ice ball extended beyond lesion: Yes   Outcome: patient tolerated procedure well with no complications   Post-procedure details: wound care instructions given    Psoriasis bil legs  Psoriasis is a chronic non-curable, but treatable genetic/hereditary disease that may have other systemic features affecting other organ systems such as joints (Psoriatic Arthritis). It is associated with an increased risk of inflammatory bowel disease,  heart disease, non-alcoholic fatty liver disease, and depression.     Start Vtama cr qhs, samples x 2 Lot [id number] exp 10/23 Cont Xtrac Laser  Tapinarof (VTAMA) 1 % CREA - bil legs Apply 1 application topically daily. Qd to aa psoriasis on  body  Related Medications calcipotriene (DOVONOX) 0.005 % cream Apply topically 2 (two) times daily. Apply to affected areas of legs  mometasone (ELOCON) 0.1 % lotion Apply topically daily. Use up to 5 days a week  Return for as scheduled for 70mf/u.  I, SOthelia Pulling RMA, am acting as scribe for DSarina Ser MD . Documentation: I have reviewed the above documentation for accuracy and completeness, and I agree with the above.  DSarina Ser MD

## 2021-02-21 NOTE — Patient Instructions (Signed)
Wound Care Instructions  Cleanse wound gently with soap and water once a day then pat dry with clean gauze. Apply a thing coat of Petrolatum (petroleum jelly, "Vaseline") over the wound (unless you have an allergy to this). We recommend that you use a new, sterile tube of Vaseline. Do not pick or remove scabs. Do not remove the yellow or white "healing tissue" from the base of the wound.  Cover the wound with fresh, clean, nonstick gauze and secure with paper tape. You may use Band-Aids in place of gauze and tape if the would is small enough, but would recommend trimming much of the tape off as there is often too much. Sometimes Band-Aids can irritate the skin.  You should call the office for your biopsy report after 1 week if you have not already been contacted.  If you experience any problems, such as abnormal amounts of bleeding, swelling, significant bruising, significant pain, or evidence of infection, please call the office immediately.  FOR ADULT SURGERY PATIENTS: If you need something for pain relief you may take 1 extra strength Tylenol (acetaminophen) AND 2 Ibuprofen (200mg each) together every 4 hours as needed for pain. (do not take these if you are allergic to them or if you have a reason you should not take them.) Typically, you may only need pain medication for 1 to 3 days.   If you have any questions or concerns for your doctor, please call our main line at [phone] and press option 4 to reach your doctor's medical assistant. If no one answers, please leave a voicemail as directed and we will return your call as soon as possible. Messages left after 4 pm will be answered the following business day.   You may also send us a message via MyChart. We typically respond to MyChart messages within 1-2 business days.  For prescription refills, please ask your pharmacy to contact our office. Our fax number is [phone].  If you have an urgent issue when the clinic is closed that  cannot wait until the next business day, you can page your doctor at the number below.    Please note that while we do our best to be available for urgent issues outside of office hours, we are not available 24/7.   If you have an urgent issue and are unable to reach us, you may choose to seek medical care at your doctor's office, retail clinic, urgent care center, or emergency room.  If you have a medical emergency, please immediately call 911 or go to the emergency department.  Pager Numbers  - Dr. Kowalski: [phone]  - Dr. Moye: [phone]  - Dr. Stewart: [phone]  In the event of inclement weather, please call our main line at [phone] for an update on the status of any delays or closures.  Dermatology Medication Tips: Please keep the boxes that topical medications come in in order to help keep track of the instructions about where and how to use these. Pharmacies typically print the medication instructions only on the boxes and not directly on the medication tubes.   If your medication is too expensive, please contact our office at [phone] option 4 or send us a message through MyChart.   We are unable to tell what your co-pay for medications will be in advance as this is different depending on your insurance coverage. However, we may be able to find a substitute medication at lower cost or fill out paperwork to get insurance to cover a needed   medication.   If a prior authorization is required to get your medication covered by your insurance company, please allow us 1-2 business days to complete this process.  Drug prices often vary depending on where the prescription is filled and some pharmacies may offer cheaper prices.  The website www.goodrx.com contains coupons for medications through different pharmacies. The prices here do not account for what the cost may be with help from insurance (it may be cheaper with your insurance), but the website can give you the  price if you did not use any insurance.  - You can print the associated coupon and take it with your prescription to the pharmacy.  - You may also stop by our office during regular business hours and pick up a GoodRx coupon card.  - If you need your prescription sent electronically to a different pharmacy, notify our office through [facility] MyChart or by phone at [phone] option 4.   

## 2021-02-21 NOTE — Progress Notes (Signed)
Total Surface Area: 260cm2 Total Energy: 99.20J  Also treated under patient's breast at 263m.

## 2021-02-21 NOTE — Telephone Encounter (Signed)
Medicare patient and Vtama not covered. Will cost over $1,000.

## 2021-02-22 NOTE — Telephone Encounter (Signed)
Patient advised. aw 

## 2021-02-27 ENCOUNTER — Telehealth: Payer: Self-pay

## 2021-02-27 NOTE — Telephone Encounter (Signed)
Spoke with pt and informed her of results. She had no concerns.

## 2021-02-27 NOTE — Telephone Encounter (Signed)
-----   Message from Ralene Bathe, MD sent at 02/27/2021 11:15 AM EDT ----- Diagnosis Skin , left proximal lateral calf WELL DIFFERENTIATED SQUAMOUS CELL CARCINOMA, BASE INVOLVED  Cancer - SCC Already treated Recheck next visit

## 2021-02-27 NOTE — Telephone Encounter (Signed)
Left message on voicemail to return my call.  

## 2021-04-09 ENCOUNTER — Other Ambulatory Visit: Payer: Self-pay

## 2021-04-09 ENCOUNTER — Ambulatory Visit (INDEPENDENT_AMBULATORY_CARE_PROVIDER_SITE_OTHER): Payer: Medicare Other

## 2021-04-09 DIAGNOSIS — L400 Psoriasis vulgaris: Secondary | ICD-10-CM

## 2021-04-09 NOTE — Progress Notes (Signed)
Total Surface Area: 384cm2 Total Energy: 176.64J

## 2021-04-16 ENCOUNTER — Ambulatory Visit (INDEPENDENT_AMBULATORY_CARE_PROVIDER_SITE_OTHER): Payer: Medicare Other

## 2021-04-16 ENCOUNTER — Other Ambulatory Visit: Payer: Self-pay

## 2021-04-16 DIAGNOSIS — L400 Psoriasis vulgaris: Secondary | ICD-10-CM

## 2021-04-16 NOTE — Progress Notes (Signed)
Total Surface Area: 364cm2 Total Energy: 192.56J

## 2021-04-23 ENCOUNTER — Ambulatory Visit (INDEPENDENT_AMBULATORY_CARE_PROVIDER_SITE_OTHER): Payer: Medicare Other

## 2021-04-23 ENCOUNTER — Other Ambulatory Visit: Payer: Self-pay

## 2021-04-23 DIAGNOSIS — L400 Psoriasis vulgaris: Secondary | ICD-10-CM

## 2021-04-23 NOTE — Progress Notes (Signed)
Total Surface Area: 336 cm2 Total Energy: 204.29 J

## 2021-04-30 ENCOUNTER — Ambulatory Visit (INDEPENDENT_AMBULATORY_CARE_PROVIDER_SITE_OTHER): Payer: Medicare Other | Admitting: Dermatology

## 2021-04-30 ENCOUNTER — Other Ambulatory Visit: Payer: Self-pay

## 2021-04-30 ENCOUNTER — Ambulatory Visit (INDEPENDENT_AMBULATORY_CARE_PROVIDER_SITE_OTHER): Payer: Medicare Other

## 2021-04-30 DIAGNOSIS — L820 Inflamed seborrheic keratosis: Secondary | ICD-10-CM | POA: Diagnosis not present

## 2021-04-30 DIAGNOSIS — L570 Actinic keratosis: Secondary | ICD-10-CM

## 2021-04-30 DIAGNOSIS — C44722 Squamous cell carcinoma of skin of right lower limb, including hip: Secondary | ICD-10-CM | POA: Diagnosis not present

## 2021-04-30 DIAGNOSIS — L400 Psoriasis vulgaris: Secondary | ICD-10-CM

## 2021-04-30 DIAGNOSIS — Z85828 Personal history of other malignant neoplasm of skin: Secondary | ICD-10-CM

## 2021-04-30 DIAGNOSIS — L409 Psoriasis, unspecified: Secondary | ICD-10-CM | POA: Diagnosis not present

## 2021-04-30 DIAGNOSIS — D485 Neoplasm of uncertain behavior of skin: Secondary | ICD-10-CM

## 2021-04-30 DIAGNOSIS — L578 Other skin changes due to chronic exposure to nonionizing radiation: Secondary | ICD-10-CM

## 2021-04-30 NOTE — Progress Notes (Signed)
Total Surface Area: 128cm2 Total Energy: 89.47J

## 2021-04-30 NOTE — Patient Instructions (Addendum)
If you have any questions or concerns for your doctor, please call our main line at [phone] and press option 4 to reach your doctor's medical assistant. If no one answers, please leave a voicemail as directed and we will return your call as soon as possible. Messages left after 4 pm will be answered the following business day.   You may also send us a message via MyChart. We typically respond to MyChart messages within 1-2 business days.  For prescription refills, please ask your pharmacy to contact our office. Our fax number is [phone].  If you have an urgent issue when the clinic is closed that cannot wait until the next business day, you can page your doctor at the number below.    Please note that while we do our best to be available for urgent issues outside of office hours, we are not available 24/7.   If you have an urgent issue and are unable to reach us, you may choose to seek medical care at your doctor's office, retail clinic, urgent care center, or emergency room.  If you have a medical emergency, please immediately call 911 or go to the emergency department.  Pager Numbers  - Dr. Kowalski: [phone]  - Dr. Moye: [phone]  - Dr. Stewart: [phone]  In the event of inclement weather, please call our main line at [phone] for an update on the status of any delays or closures.  Dermatology Medication Tips: Please keep the boxes that topical medications come in in order to help keep track of the instructions about where and how to use these. Pharmacies typically print the medication instructions only on the boxes and not directly on the medication tubes.   If your medication is too expensive, please contact our office at [phone] option 4 or send us a message through MyChart.   We are unable to tell what your co-pay for medications will be in advance as this is different depending on your insurance coverage. However, we may be able to find a substitute  medication at lower cost or fill out paperwork to get insurance to cover a needed medication.   If a prior authorization is required to get your medication covered by your insurance company, please allow us 1-2 business days to complete this process.  Drug prices often vary depending on where the prescription is filled and some pharmacies may offer cheaper prices.  The website www.goodrx.com contains coupons for medications through different pharmacies. The prices here do not account for what the cost may be with help from insurance (it may be cheaper with your insurance), but the website can give you the price if you did not use any insurance.  - You can print the associated coupon and take it with your prescription to the pharmacy.  - You may also stop by our office during regular business hours and pick up a GoodRx coupon card.  - If you need your prescription sent electronically to a different pharmacy, notify our office through [facility] MyChart or by phone at [phone] option 4.   Wound Care Instructions  Cleanse wound gently with soap and water once a day then pat dry with clean gauze. Apply a thing coat of Petrolatum (petroleum jelly, "Vaseline") over the wound (unless you have an allergy to this). We recommend that you use a new, sterile tube of Vaseline. Do not pick or remove scabs. Do not remove the yellow or white "healing tissue" from the base of the wound.  Cover the   wound with fresh, clean, nonstick gauze and secure with paper tape. You may use Band-Aids in place of gauze and tape if the would is small enough, but would recommend trimming much of the tape off as there is often too much. Sometimes Band-Aids can irritate the skin.  You should call the office for your biopsy report after 1 week if you have not already been contacted.  If you experience any problems, such as abnormal amounts of bleeding, swelling, significant bruising, significant pain, or evidence of infection,  please call the office immediately.  FOR ADULT SURGERY PATIENTS: If you need something for pain relief you may take 1 extra strength Tylenol (acetaminophen) AND 2 Ibuprofen (200mg each) together every 4 hours as needed for pain. (do not take these if you are allergic to them or if you have a reason you should not take them.) Typically, you may only need pain medication for 1 to 3 days.    

## 2021-04-30 NOTE — Progress Notes (Signed)
Follow-Up Visit   Subjective  Lori Dalton is a 80 y.o. female who presents for the following: irregular skin lesions (B/L lower leg - patient has a hx of SCC and would like lesions checked today).  The following portions of the chart were reviewed this encounter and updated as appropriate:   Allergies  Meds  Problems  Med Hx  Surg Hx  Fam Hx     Review of Systems:  No other skin or systemic complaints except as noted in HPI or Assessment and Plan.  Objective  Well appearing patient in no apparent distress; mood and affect are within normal limits.  A focused examination was performed including B/L leg. Relevant physical exam findings are noted in the Assessment and Plan.  B/L leg, trunk, extremities Well-demarcated erythematous papules/plaques with silvery scale, guttate pink scaly papules.   B/L leg x 15 (15) Erythematous keratotic or waxy stuck-on papule or plaque.   B/L leg x 10 (10) Erythematous thin papules/macules with gritty scale.   R mid lat calf Hyperkeratotic papule 1.2 cm    Assessment & Plan  Psoriasis - flared B/L leg, trunk, extremities  Psoriasis is a chronic non-curable, but treatable genetic/hereditary disease that may have other systemic features affecting other organ systems such as joints (Psoriatic Arthritis). It is associated with an increased risk of inflammatory bowel disease, heart disease, non-alcoholic fatty liver disease, and depression.    Continue Mometasone 0.1% to aa's QD PRN.  Continue Calcipotriene cream to aa's QD PRN.  Continue Xtrac Laser Pt did not tolerate Otezla. She does not want to consider injectable biologics. May discuss SOTIKTU at fu appt.  Topical steroids (such as triamcinolone, fluocinolone, fluocinonide, mometasone, clobetasol, halobetasol, betamethasone, hydrocortisone) can cause thinning and lightening of the skin if they are used for too long in the same area. Your physician has selected the right strength  medicine for your problem and area affected on the body. Please use your medication only as directed by your physician to prevent side effects.   Related Medications calcipotriene (DOVONOX) 0.005 % cream Apply topically 2 (two) times daily. Apply to affected areas of legs  mometasone (ELOCON) 0.1 % lotion Apply topically daily. Use up to 5 days a week  Tapinarof (VTAMA) 1 % CREA Apply 1 application topically daily. Qd to aa psoriasis on body  Inflamed seborrheic keratosis B/L leg x 15 Destruction of lesion - B/L leg x 15 Complexity: simple   Destruction method: cryotherapy   Informed consent: discussed and consent obtained   Timeout:  patient name, date of birth, surgical site, and procedure verified Lesion destroyed using liquid nitrogen: Yes   Region frozen until ice ball extended beyond lesion: Yes   Outcome: patient tolerated procedure well with no complications   Post-procedure details: wound care instructions given    AK (actinic keratosis) (10) B/L leg x 10 Destruction of lesion - B/L leg x 10 Complexity: simple   Destruction method: cryotherapy   Informed consent: discussed and consent obtained   Timeout:  patient name, date of birth, surgical site, and procedure verified Lesion destroyed using liquid nitrogen: Yes   Region frozen until ice ball extended beyond lesion: Yes   Outcome: patient tolerated procedure well with no complications   Post-procedure details: wound care instructions given    Neoplasm of uncertain behavior of skin R mid lat calf Epidermal / dermal shaving  Lesion diameter (cm):  1.2 Informed consent: discussed and consent obtained   Timeout: patient name, date of birth,  surgical site, and procedure verified   Procedure prep:  Patient was prepped and draped in usual sterile fashion Prep type:  Isopropyl alcohol Anesthesia: the lesion was anesthetized in a standard fashion   Anesthetic:  1% lidocaine w/ epinephrine 1-100,000 buffered w/ 8.4%  NaHCO3 Instrument used: flexible razor blade   Hemostasis achieved with: pressure, aluminum chloride and electrodesiccation   Outcome: patient tolerated procedure well   Post-procedure details: sterile dressing applied and wound care instructions given   Dressing type: bandage and petrolatum    Destruction of lesion Complexity: extensive   Destruction method: electrodesiccation and curettage   Informed consent: discussed and consent obtained   Timeout:  patient name, date of birth, surgical site, and procedure verified Procedure prep:  Patient was prepped and draped in usual sterile fashion Prep type:  Isopropyl alcohol Anesthesia: the lesion was anesthetized in a standard fashion   Anesthetic:  1% lidocaine w/ epinephrine 1-100,000 buffered w/ 8.4% NaHCO3 Curettage performed in three different directions: Yes   Electrodesiccation performed over the curetted area: Yes   Lesion length (cm):  1.2 Lesion width (cm):  1.2 Margin per side (cm):  0.2 Final wound size (cm):  1.6 Hemostasis achieved with:  pressure, aluminum chloride and electrodesiccation Outcome: patient tolerated procedure well with no complications   Post-procedure details: sterile dressing applied and wound care instructions given   Dressing type: bandage and petrolatum    Specimen 1 - Surgical pathology Differential Diagnosis: D48.5 r/o SCC ED&C today  Check Margins: No  Actinic Damage - chronic, secondary to cumulative UV radiation exposure/sun exposure over time - diffuse scaly erythematous macules with underlying dyspigmentation - Recommend daily broad spectrum sunscreen SPF 30+ to sun-exposed areas, reapply every 2 hours as needed.  - Recommend staying in the shade or wearing long sleeves, sun glasses (UVA+UVB protection) and wide brim hats (4-inch brim around the entire circumference of the hat). - Call for new or changing lesions.  Return in about 4 months (around 08/31/2021) for psoriasis follow up .  Luther Redo, CMA, am acting as scribe for Sarina Ser, MD . Documentation: I have reviewed the above documentation for accuracy and completeness, and I agree with the above.  Sarina Ser, MD

## 2021-05-01 ENCOUNTER — Encounter: Payer: Self-pay | Admitting: Dermatology

## 2021-05-02 ENCOUNTER — Telehealth: Payer: Self-pay

## 2021-05-02 NOTE — Telephone Encounter (Signed)
Left message for patient to call office for results/hd 

## 2021-05-02 NOTE — Telephone Encounter (Signed)
Patient advised of BX results.  °

## 2021-05-02 NOTE — Telephone Encounter (Signed)
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

## 2021-05-07 ENCOUNTER — Ambulatory Visit: Payer: Medicare Other | Admitting: Dermatology

## 2021-05-07 ENCOUNTER — Ambulatory Visit (INDEPENDENT_AMBULATORY_CARE_PROVIDER_SITE_OTHER): Payer: Medicare Other

## 2021-05-07 ENCOUNTER — Other Ambulatory Visit: Payer: Self-pay

## 2021-05-07 DIAGNOSIS — L400 Psoriasis vulgaris: Secondary | ICD-10-CM | POA: Diagnosis not present

## 2021-05-07 NOTE — Progress Notes (Signed)
Total Surface Area: 200cm2 Total Energy: 160.60J

## 2021-05-15 ENCOUNTER — Other Ambulatory Visit: Payer: Self-pay

## 2021-05-15 ENCOUNTER — Ambulatory Visit (INDEPENDENT_AMBULATORY_CARE_PROVIDER_SITE_OTHER): Payer: Medicare Other

## 2021-05-15 DIAGNOSIS — L400 Psoriasis vulgaris: Secondary | ICD-10-CM | POA: Diagnosis not present

## 2021-05-15 NOTE — Progress Notes (Signed)
Total Surface Area: 328cm2 Total Energy: 302.Lori Dalton

## 2021-05-22 ENCOUNTER — Other Ambulatory Visit: Payer: Self-pay

## 2021-05-22 ENCOUNTER — Ambulatory Visit (INDEPENDENT_AMBULATORY_CARE_PROVIDER_SITE_OTHER): Payer: Medicare Other

## 2021-05-22 DIAGNOSIS — L400 Psoriasis vulgaris: Secondary | ICD-10-CM | POA: Diagnosis not present

## 2021-05-22 NOTE — Progress Notes (Signed)
Total Surface Area: 172cm2 Total Energy: 174.58J

## 2021-05-30 ENCOUNTER — Ambulatory Visit (INDEPENDENT_AMBULATORY_CARE_PROVIDER_SITE_OTHER): Payer: Medicare Other | Admitting: Dermatology

## 2021-05-30 ENCOUNTER — Ambulatory Visit (INDEPENDENT_AMBULATORY_CARE_PROVIDER_SITE_OTHER): Payer: Medicare Other

## 2021-05-30 ENCOUNTER — Other Ambulatory Visit: Payer: Self-pay

## 2021-05-30 DIAGNOSIS — L821 Other seborrheic keratosis: Secondary | ICD-10-CM

## 2021-05-30 DIAGNOSIS — L400 Psoriasis vulgaris: Secondary | ICD-10-CM

## 2021-05-30 DIAGNOSIS — L578 Other skin changes due to chronic exposure to nonionizing radiation: Secondary | ICD-10-CM

## 2021-05-30 DIAGNOSIS — L820 Inflamed seborrheic keratosis: Secondary | ICD-10-CM | POA: Diagnosis not present

## 2021-05-30 NOTE — Progress Notes (Signed)
Total Surface Area: 328cm2 Total Energy: 332.92J

## 2021-05-30 NOTE — Patient Instructions (Signed)

## 2021-05-30 NOTE — Progress Notes (Signed)
   Follow-Up Visit   Subjective  Lori Dalton is a 80 y.o. female who presents for the following: Other (Spot right lower leg).  The following portions of the chart were reviewed this encounter and updated as appropriate:   Allergies  Meds  Problems  Med Hx  Surg Hx  Fam Hx     Review of Systems:  No other skin or systemic complaints except as noted in HPI or Assessment and Plan.  Objective  Well appearing patient in no apparent distress; mood and affect are within normal limits.  A focused examination was performed including legs. Relevant physical exam findings are noted in the Assessment and Plan.  Right calf Erythematous keratotic or waxy stuck-on papule or plaque.    Assessment & Plan   Actinic Damage - chronic, secondary to cumulative UV radiation exposure/sun exposure over time - diffuse scaly erythematous macules with underlying dyspigmentation - Recommend daily broad spectrum sunscreen SPF 30+ to sun-exposed areas, reapply every 2 hours as needed.  - Recommend staying in the shade or wearing long sleeves, sun glasses (UVA+UVB protection) and wide brim hats (4-inch brim around the entire circumference of the hat). - Call for new or changing lesions.  Seborrheic Keratoses - Stuck-on, waxy, tan-brown papules and/or plaques  - Benign-appearing - Discussed benign etiology and prognosis. - Observe - Call for any changes  Inflamed seborrheic keratosis Right calf  Destruction of lesion - Right calf Complexity: simple   Destruction method: cryotherapy   Informed consent: discussed and consent obtained   Timeout:  patient name, date of birth, surgical site, and procedure verified Lesion destroyed using liquid nitrogen: Yes   Region frozen until ice ball extended beyond lesion: Yes   Outcome: patient tolerated procedure well with no complications   Post-procedure details: wound care instructions given    Return for Follow up as scheduled.  I, Ashok Cordia,  CMA, am acting as scribe for Sarina Ser, MD . Documentation: I have reviewed the above documentation for accuracy and completeness, and I agree with the above.  Sarina Ser, MD

## 2021-05-31 ENCOUNTER — Encounter: Payer: Self-pay | Admitting: Dermatology

## 2021-06-11 ENCOUNTER — Ambulatory Visit (INDEPENDENT_AMBULATORY_CARE_PROVIDER_SITE_OTHER): Payer: Medicare Other

## 2021-06-11 ENCOUNTER — Other Ambulatory Visit: Payer: Self-pay

## 2021-06-11 DIAGNOSIS — L400 Psoriasis vulgaris: Secondary | ICD-10-CM

## 2021-06-11 NOTE — Progress Notes (Signed)
Total Surface Area: 360cm2 Total Energy: 365.40J

## 2021-06-18 ENCOUNTER — Ambulatory Visit: Payer: Medicare Other

## 2021-06-25 ENCOUNTER — Other Ambulatory Visit: Payer: Self-pay

## 2021-06-25 ENCOUNTER — Ambulatory Visit (INDEPENDENT_AMBULATORY_CARE_PROVIDER_SITE_OTHER): Payer: Medicare Other

## 2021-06-25 DIAGNOSIS — L400 Psoriasis vulgaris: Secondary | ICD-10-CM

## 2021-06-25 DIAGNOSIS — L409 Psoriasis, unspecified: Secondary | ICD-10-CM

## 2021-06-25 NOTE — Progress Notes (Signed)
Total Surface Area: 236cm2 Total Energy: 239.54J

## 2021-07-02 ENCOUNTER — Ambulatory Visit (INDEPENDENT_AMBULATORY_CARE_PROVIDER_SITE_OTHER): Payer: Medicare Other

## 2021-07-02 ENCOUNTER — Other Ambulatory Visit: Payer: Self-pay

## 2021-07-02 DIAGNOSIS — L400 Psoriasis vulgaris: Secondary | ICD-10-CM

## 2021-07-02 NOTE — Progress Notes (Signed)
Total Surface Area: 304cm2 Total Energy: 308.[place]

## 2021-07-09 ENCOUNTER — Ambulatory Visit: Payer: Medicare Other

## 2021-07-17 ENCOUNTER — Ambulatory Visit: Payer: Medicare Other

## 2021-07-24 ENCOUNTER — Encounter: Payer: Self-pay | Admitting: Dermatology

## 2021-07-24 ENCOUNTER — Ambulatory Visit (INDEPENDENT_AMBULATORY_CARE_PROVIDER_SITE_OTHER): Payer: Medicare Other | Admitting: Dermatology

## 2021-07-24 ENCOUNTER — Other Ambulatory Visit: Payer: Self-pay

## 2021-07-24 DIAGNOSIS — L409 Psoriasis, unspecified: Secondary | ICD-10-CM

## 2021-07-24 DIAGNOSIS — C44722 Squamous cell carcinoma of skin of right lower limb, including hip: Secondary | ICD-10-CM | POA: Diagnosis not present

## 2021-07-24 DIAGNOSIS — C4492 Squamous cell carcinoma of skin, unspecified: Secondary | ICD-10-CM

## 2021-07-24 DIAGNOSIS — D492 Neoplasm of unspecified behavior of bone, soft tissue, and skin: Secondary | ICD-10-CM

## 2021-07-24 HISTORY — DX: Squamous cell carcinoma of skin, unspecified: C44.92

## 2021-07-24 MED ORDER — ZORYVE 0.3 % EX CREA: 1.0000 "application " | TOPICAL_CREAM | Freq: Every day | CUTANEOUS | 3 refills | Status: AC

## 2021-07-24 NOTE — Patient Instructions (Addendum)

## 2021-07-24 NOTE — Progress Notes (Signed)
Follow-Up Visit   Subjective  Lori Dalton is a 81 y.o. female who presents for the following: check growth (R lower leg, 2 weeks, painful). She has psoriasis and uses topicals and Xtrac laser treatments. She did not tolerate oral Otezla.  She could not afford the expensive Vtama and insurance did not cover well ($1500.00)  The following portions of the chart were reviewed this encounter and updated as appropriate:   Allergies   Meds   Problems   Med Hx   Surg Hx   Fam Hx       Review of Systems:  No other skin or systemic complaints except as noted in HPI or Assessment and Plan.  Objective  Well appearing patient in no apparent distress; mood and affect are within normal limits.  A focused examination was performed including right lower leg. Relevant physical exam findings are noted in the Assessment and Plan.  Right Lower Leg - Anterior Hyperkeratotic pap 1.7cm       legs, scalp, arms, trunk Well-demarcated erythematous papules/plaques with silvery scale, guttate pink scaly papules.    Assessment & Plan  Neoplasm of skin Right Lower Leg - Anterior  Epidermal / dermal shaving  Lesion diameter (cm):  1.7 Informed consent: discussed and consent obtained   Timeout: patient name, date of birth, surgical site, and procedure verified   Procedure prep:  Patient was prepped and draped in usual sterile fashion Prep type:  Isopropyl alcohol Anesthesia: the lesion was anesthetized in a standard fashion   Anesthetic:  1% lidocaine w/ epinephrine 1-100,000 buffered w/ 8.4% NaHCO3 Instrument used: flexible razor blade   Hemostasis achieved with: pressure, aluminum chloride and electrodesiccation   Outcome: patient tolerated procedure well   Post-procedure details: sterile dressing applied and wound care instructions given   Dressing type: bandage and petrolatum    Destruction of lesion Complexity: extensive   Destruction method: electrodesiccation and curettage    Informed consent: discussed and consent obtained   Timeout:  patient name, date of birth, surgical site, and procedure verified Procedure prep:  Patient was prepped and draped in usual sterile fashion Prep type:  Isopropyl alcohol Anesthesia: the lesion was anesthetized in a standard fashion   Anesthetic:  1% lidocaine w/ epinephrine 1-100,000 buffered w/ 8.4% NaHCO3 Curettage performed in three different directions: Yes   Electrodesiccation performed over the curetted area: Yes   Final wound size (cm):  1.7 Hemostasis achieved with:  pressure, aluminum chloride and electrodesiccation Outcome: patient tolerated procedure well with no complications   Post-procedure details: sterile dressing applied and wound care instructions given   Dressing type: bandage and petrolatum    Specimen 1 - Surgical pathology Differential Diagnosis: D48.5 R/O SCC  Check Margins: No Hyperkeratotic pap 1.7cm EDC  Psoriasis legs, scalp, arms, trunk  Psoriasis is a chronic non-curable, but treatable genetic/hereditary disease that may have other systemic features affecting other organ systems such as joints (Psoriatic Arthritis). It is associated with an increased risk of inflammatory bowel disease, heart disease, non-alcoholic fatty liver disease, and depression.     Cont Mometasone sol to aa qd prn Cont Calcipotriene cr qd to aa prn Start Zoryve cr qd to aa prn flares, samples x 2 TDCA 10/2022 Cont Xtrac Laxer but not to lower legs  Roflumilast (ZORYVE) 0.3 % CREA - legs, scalp, arms, trunk Apply 1 application topically daily. Qd to aa psoriasis  Related Medications calcipotriene (DOVONOX) 0.005 % cream Apply topically 2 (two) times daily. Apply to affected areas of  legs  mometasone (ELOCON) 0.1 % lotion Apply topically daily. Use up to 5 days a week  Tapinarof (VTAMA) 1 % CREA Apply 1 application topically daily. Qd to aa psoriasis on body   Return for as scheduled for psoriasis f/u.  I,  Othelia Pulling, RMA, am acting as scribe for Sarina Ser, MD . Documentation: I have reviewed the above documentation for accuracy and completeness, and I agree with the above.  Sarina Ser, MD

## 2021-07-25 ENCOUNTER — Telehealth: Payer: Self-pay

## 2021-07-25 NOTE — Telephone Encounter (Signed)
Spoke with pt and informed her of results. She had no concerns.

## 2021-07-25 NOTE — Telephone Encounter (Signed)
Left pt msg to call for bx results/sh 

## 2021-07-25 NOTE — Telephone Encounter (Signed)
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

## 2021-09-03 ENCOUNTER — Ambulatory Visit (INDEPENDENT_AMBULATORY_CARE_PROVIDER_SITE_OTHER): Payer: Medicare Other | Admitting: Dermatology

## 2021-09-03 ENCOUNTER — Other Ambulatory Visit: Payer: Self-pay

## 2021-09-03 DIAGNOSIS — L409 Psoriasis, unspecified: Secondary | ICD-10-CM

## 2021-09-03 DIAGNOSIS — D485 Neoplasm of uncertain behavior of skin: Secondary | ICD-10-CM

## 2021-09-03 DIAGNOSIS — L820 Inflamed seborrheic keratosis: Secondary | ICD-10-CM

## 2021-09-03 DIAGNOSIS — C44729 Squamous cell carcinoma of skin of left lower limb, including hip: Secondary | ICD-10-CM | POA: Diagnosis not present

## 2021-09-03 NOTE — Patient Instructions (Signed)
Electrodesiccation and Curettage (Scrape and Burn) Wound Care Instructions  Leave the original bandage on for 24 hours if possible.  If the bandage becomes soaked or soiled before that time, it is OK to remove it and examine the wound.  A small amount of post-operative bleeding is normal.  If excessive bleeding occurs, remove the bandage, place gauze over the site and apply continuous pressure (no peeking) over the area for 30 minutes. If this does not work, please call our clinic as soon as possible or page your doctor if it is after hours.   Once a day, cleanse the wound with soap and water. It is fine to shower. If a thick crust develops you may use a Q-tip dipped into dilute hydrogen peroxide (mix 1:1 with water) to dissolve it.  Hydrogen peroxide can slow the healing process, so use it only as needed.    After washing, apply petroleum jelly (Vaseline) or an antibiotic ointment if your doctor prescribed one for you, followed by a bandage.    For best healing, the wound should be covered with a layer of ointment at all times. If you are not able to keep the area covered with a bandage to hold the ointment in place, this may mean re-applying the ointment several times a day.  Continue this wound care until the wound has healed and is no longer open. It may take several weeks for the wound to heal and close.  Itching and mild discomfort is normal during the healing process.  If you have any discomfort, you can take Tylenol (acetaminophen) or ibuprofen as directed on the bottle. (Please do not take these if you have an allergy to them or cannot take them for another reason).  Some redness, tenderness and white or yellow material in the wound is normal healing.  If the area becomes very sore and red, or develops a thick yellow-green material (pus), it may be infected; please notify us.    Wound healing continues for up to one year following surgery. It is not unusual to experience pain in the scar  from time to time during the interval.  If the pain becomes severe or the scar thickens, you should notify the office.    A slight amount of redness in a scar is expected for the first six months.  After six months, the redness will fade and the scar will soften and fade.  The color difference becomes less noticeable with time.  If there are any problems, return for a post-op surgery check at your earliest convenience.  To improve the appearance of the scar, you can use silicone scar gel, cream, or sheets (such as Mederma or Serica) every night for up to one year. These are available over the counter (without a prescription).  Please call our office at [phone] for any questions or concerns.  If You Need Anything After Your Visit  If you have any questions or concerns for your doctor, please call our main line at [phone] and press option 4 to reach your doctor's medical assistant. If no one answers, please leave a voicemail as directed and we will return your call as soon as possible. Messages left after 4 pm will be answered the following business day.   You may also send [place] a message via [place]. We typically respond to MyChart messages within 1-2 business days.  For prescription refills, please ask your pharmacy to contact our office. Our fax number is [phone].  If you have an  urgent issue when the clinic is closed that cannot wait until the next business day, you can page your doctor at the number below.    Please note that while we do our best to be available for urgent issues outside of office hours, we are not available 24/7.   If you have an urgent issue and are unable to reach [place], you may choose to seek medical care at your doctor's office, retail clinic, urgent care center, or emergency room.  If you have a medical emergency, please immediately call 911 or go to the emergency department.  Pager Numbers  - Dr. Nehemiah Massed: [phone]  - Dr. Laurence Ferrari: [phone]  -  Dr. Nicole Kindred: [phone]  In the event of inclement weather, please call our main line at [phone] for an update on the status of any delays or closures.  Dermatology Medication Tips: Please keep the boxes that topical medications come in in order to help keep track of the instructions about where and how to use these. Pharmacies typically print the medication instructions only on the boxes and not directly on the medication tubes.   If your medication is too expensive, please contact our office at [phone] option 4 or send [place] a message through [place].   We are unable to tell what your co-pay for medications will be in advance as this is different depending on your insurance coverage. However, we may be able to find a substitute medication at lower cost or fill out paperwork to get insurance to cover a needed medication.   If a prior authorization is required to get your medication covered by your insurance company, please allow [place] 1-2 business days to complete this process.  Drug prices often vary depending on where the prescription is filled and some pharmacies may offer cheaper prices.  The website www.goodrx.com contains coupons for medications through different pharmacies. The prices here do not account for what the cost may be with help from insurance (it may be cheaper with your insurance), but the website can give you the price if you did not use any insurance.  - You can print the associated coupon and take it with your prescription to the pharmacy.  - You may also stop by our office during regular business hours and pick up a GoodRx coupon card.  - If you need your prescription sent electronically to a different pharmacy, notify our office through [facility] or by phone at [phone] option 4.     Si Usted Necesita Algo Despus de Su Visita  Tambin puede enviarnos un mensaje a travs de Pharmacist, community. Por lo general respondemos a los mensajes de MyChart en el  transcurso de 1 a 2 das hbiles.  Para renovar recetas, por favor pida a su farmacia que se ponga en contacto con nuestra oficina. Harland Dingwall de fax es [place] [phone].  Si tiene un asunto urgente cuando la clnica est cerrada y que no puede esperar hasta el siguiente da hbil, puede llamar/localizar a su doctor(a) al nmero que aparece a continuacin.   Por favor, tenga en cuenta que aunque hacemos todo lo posible para estar disponibles para asuntos urgentes fuera del horario de [place], no estamos disponibles las 24 horas del da, los 7 das de la [place].   Si tiene un problema urgente y no puede comunicarse con nosotros, puede optar por buscar atencin mdica  en el consultorio de su doctor(a), en una clnica privada, en un centro de atencin urgente o en una sala de emergencias.  Si tiene Engineering geologist, por favor llame inmediatamente al 911 o vaya a la sala de emergencias.  Nmeros de bper  - Dr. Nehemiah Massed: [phone]  - Dra. Moye: [phone]  - Dra. Nicole Kindred: [phone]  En caso de inclemencias del [place], por favor llame a Johnsie Kindred principal al [phone] para una actualizacin sobre el [place] de cualquier retraso o cierre.  Consejos para la medicacin en dermatologa: Por favor, guarde las cajas en las que vienen los medicamentos de uso tpico para ayudarle a seguir las instrucciones sobre dnde y cmo usarlos. Las farmacias generalmente imprimen las instrucciones del medicamento slo en las cajas y no directamente en los tubos del [place].   Si su medicamento es muy caro, por favor, pngase en contacto con Zigmund Daniel llamando al [phone] y presione la opcin 4 o envenos un mensaje a travs de Pharmacist, community.   No podemos decirle cul ser su copago por los medicamentos por adelantado ya que esto es diferente dependiendo de la cobertura de su seguro. Sin embargo, es posible que podamos encontrar un medicamento sustituto a Electrical engineer un  formulario para que el seguro cubra el medicamento que se considera necesario.   Si se requiere una autorizacin previa para que su compaa de seguros [place] su medicamento, por favor permtanos de 1 a 2 das hbiles para completar este proceso.  Los precios de los medicamentos varan con frecuencia dependiendo del Environmental consultant de dnde se surte la receta y alguna farmacias pueden ofrecer precios ms baratos.  El sitio web www.goodrx.com tiene cupones para medicamentos de Airline pilot. Los precios aqu no tienen en cuenta lo que podra costar con la ayuda del seguro (puede ser ms barato con su seguro), pero el sitio web puede darle el precio si no utiliz Research scientist (physical sciences).  - Puede imprimir el cupn correspondiente y llevarlo con su receta a la farmacia.  - Tambin puede pasar por nuestra oficina durante el horario de atencin regular y Charity fundraiser una tarjeta de cupones de GoodRx.  - Si necesita que su receta se enve electrnicamente a una farmacia diferente, informe a nuestra oficina a travs de MyChart de [place] o por telfono llamando al [phone] y presione la opcin 4.

## 2021-09-03 NOTE — Progress Notes (Signed)
Follow-Up Visit   Subjective  Lori Dalton is a 81 y.o. female who presents for the following: Psoriasis (Patient currently using Zoryve samples QD. It was sent to her pharmacy but costs over $800 so our office has been supplying her with samples. She is also using OTC coal tar cream as she heard that it was helpful for psoriasis. Her legs have improved but are still flaring. ) and Irregular skin lesions (On the R chest x 2 and L pretibial x 1 - patient would like them checked and treated today. ).  The following portions of the chart were reviewed this encounter and updated as appropriate:   Allergies   Meds   Problems   Med Hx   Surg Hx   Fam Hx      Review of Systems:  No other skin or systemic complaints except as noted in HPI or Assessment and Plan.  Objective  Well appearing patient in no apparent distress; mood and affect are within normal limits.  A focused examination was performed including the legs and chest. Relevant physical exam findings are noted in the Assessment and Plan.  L pretibial 1.2 cm hyperkeratotic papule.     R chest x 2 (2) Erythematous stuck-on, waxy papule or plaque   Assessment & Plan  Psoriasis B/L leg  Psoriasis is a chronic non-curable, but treatable genetic/hereditary disease that may have other systemic features affecting other organ systems such as joints (Psoriatic Arthritis). It is associated with an increased risk of inflammatory bowel disease, heart disease, non-alcoholic fatty liver disease, and depression.    Patient has tried and failed Otezla in the past but she was unable to tolerate it due to bright red rash on the legs.   Continue Zoryve cream to aa's QD. Samples given since over $800 with insurance.   Discussed wrapping legs after applying coal tar cream with Ceran wrap. Afterward put on flannel pajamas and let sit a few hours. Wash off before bed.   Related Medications calcipotriene (DOVONOX) 0.005 % cream Apply topically  2 (two) times daily. Apply to affected areas of legs  mometasone (ELOCON) 0.1 % lotion Apply topically daily. Use up to 5 days a week  Tapinarof (VTAMA) 1 % CREA Apply 1 application topically daily. Qd to aa psoriasis on body  Roflumilast (ZORYVE) 0.3 % CREA Apply 1 application topically daily. Qd to aa psoriasis  Neoplasm of uncertain behavior of skin L pretibial  Epidermal / dermal shaving  Lesion diameter (cm):  1.2 Informed consent: discussed and consent obtained   Timeout: patient name, date of birth, surgical site, and procedure verified   Procedure prep:  Patient was prepped and draped in usual sterile fashion Prep type:  Isopropyl alcohol Anesthesia: the lesion was anesthetized in a standard fashion   Anesthetic:  1% lidocaine w/ epinephrine 1-100,000 buffered w/ 8.4% NaHCO3 Instrument used: flexible razor blade   Hemostasis achieved with: pressure, aluminum chloride and electrodesiccation   Outcome: patient tolerated procedure well   Post-procedure details: sterile dressing applied and wound care instructions given   Dressing type: bandage and petrolatum    Destruction of lesion Complexity: extensive   Destruction method: electrodesiccation and curettage   Informed consent: discussed and consent obtained   Timeout:  patient name, date of birth, surgical site, and procedure verified Procedure prep:  Patient was prepped and draped in usual sterile fashion Prep type:  Isopropyl alcohol Anesthesia: the lesion was anesthetized in a standard fashion   Anesthetic:  1%  lidocaine w/ epinephrine 1-100,000 buffered w/ 8.4% NaHCO3 Curettage performed in three different directions: Yes   Electrodesiccation performed over the curetted area: Yes   Lesion length (cm):  1.2 Lesion width (cm):  1.2 Margin per side (cm):  0.2 Final wound size (cm):  1.6 Hemostasis achieved with:  pressure, aluminum chloride and electrodesiccation Outcome: patient tolerated procedure well with no  complications   Post-procedure details: sterile dressing applied and wound care instructions given   Dressing type: bandage and petrolatum    Specimen 1 - Surgical pathology Differential Diagnosis: D48.5 r/o SCC  ED&C today  Check Margins: No  Inflamed seborrheic keratosis (2) R chest x 2  Destruction of lesion - R chest x 2 Complexity: simple   Destruction method: cryotherapy   Informed consent: discussed and consent obtained   Timeout:  patient name, date of birth, surgical site, and procedure verified Lesion destroyed using liquid nitrogen: Yes   Region frozen until ice ball extended beyond lesion: Yes   Outcome: patient tolerated procedure well with no complications   Post-procedure details: wound care instructions given     Return for psoriasis follow up in 3-4 mths.  Luther Redo, CMA, am acting as scribe for Sarina Ser, MD . Documentation: I have reviewed the above documentation for accuracy and completeness, and I agree with the above.  Sarina Ser, MD

## 2021-09-05 ENCOUNTER — Telehealth: Payer: Self-pay

## 2021-09-05 NOTE — Telephone Encounter (Signed)
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

## 2021-09-05 NOTE — Telephone Encounter (Signed)
Advised patient of results/hd  

## 2021-09-07 ENCOUNTER — Encounter: Payer: Self-pay | Admitting: Dermatology

## 2021-09-27 ENCOUNTER — Ambulatory Visit (INDEPENDENT_AMBULATORY_CARE_PROVIDER_SITE_OTHER): Payer: Medicare Other | Admitting: Dermatology

## 2021-09-27 ENCOUNTER — Other Ambulatory Visit: Payer: Self-pay

## 2021-09-27 DIAGNOSIS — L570 Actinic keratosis: Secondary | ICD-10-CM | POA: Diagnosis not present

## 2021-09-27 DIAGNOSIS — D492 Neoplasm of unspecified behavior of bone, soft tissue, and skin: Secondary | ICD-10-CM

## 2021-09-27 NOTE — Progress Notes (Unsigned)
° °  Follow-Up Visit   Subjective  Lori Dalton is a 81 y.o. female who presents for the following: Skin Problem (Pt c/o growth on the right leg growing and changing for ~3 weeks ). Hx of SCC    The following portions of the chart were reviewed this encounter and updated as appropriate:       Review of Systems:  No other skin or systemic complaints except as noted in HPI or Assessment and Plan.  Objective  Well appearing patient in no apparent distress; mood and affect are within normal limits.  A focused examination was performed including right lower leg. Relevant physical exam findings are noted in the Assessment and Plan.  Right Lower Leg - Anterior 0.6 cm pink crusted papule         Assessment & Plan  Neoplasm of skin Right Lower Leg - Anterior  Epidermal / dermal shaving  Lesion diameter (cm):  0.6 Informed consent: discussed and consent obtained   Timeout: patient name, date of birth, surgical site, and procedure verified   Procedure prep:  Patient was prepped and draped in usual sterile fashion Prep type:  Isopropyl alcohol Anesthesia: the lesion was anesthetized in a standard fashion   Anesthetic:  1% lidocaine w/ epinephrine 1-100,000 buffered w/ 8.4% NaHCO3 Hemostasis achieved with: pressure, aluminum chloride and electrodesiccation   Outcome: patient tolerated procedure well   Post-procedure details: sterile dressing applied and wound care instructions given   Dressing type: bandage and petrolatum    Destruction of lesion  Destruction method: electrodesiccation and curettage   Informed consent: discussed and consent obtained   Timeout:  patient name, date of birth, surgical site, and procedure verified Anesthesia: the lesion was anesthetized in a standard fashion   Anesthetic:  1% lidocaine w/ epinephrine 1-100,000 buffered w/ 8.4% NaHCO3 Curettage performed in three different directions: Yes   Electrodesiccation performed over the curetted area:  Yes   Curettage cycles:  3 Final wound size (cm):  1.4 Hemostasis achieved with:  electrodesiccation Outcome: patient tolerated procedure well with no complications   Post-procedure details: sterile dressing applied and wound care instructions given   Dressing type: petrolatum    Specimen 1 - Surgical pathology Differential Diagnosis: R/O SCC  Check Margins: No   Return for psoriasis, SCC as scheduled Dec 10, 2021.  IMarye Round, CMA, am acting as scribe for Sarina Ser, MD .

## 2021-09-27 NOTE — Patient Instructions (Addendum)

## 2021-10-01 ENCOUNTER — Telehealth: Payer: Self-pay

## 2021-10-01 NOTE — Telephone Encounter (Signed)
-----   Message from Ralene Bathe, MD sent at 09/30/2021  1:20 PM EDT ----- ?Diagnosis ?Skin , right lower leg - anterior ?HYPERTROPHIC ACTINIC KERATOSIS (DEEPER UNDERLYING INVASION NOT ENTIRELY EXCLUDED) ? ?PreCancer  ?Already treated  ?Recheck next visit ?

## 2021-10-01 NOTE — Telephone Encounter (Signed)
Discussed biopsy results with pt  °

## 2021-10-01 NOTE — Telephone Encounter (Signed)
Left message on voicemail to return my call.  

## 2021-10-03 ENCOUNTER — Encounter: Payer: Self-pay | Admitting: Dermatology

## 2021-11-23 ENCOUNTER — Other Ambulatory Visit: Payer: Self-pay | Admitting: Family Medicine

## 2021-11-23 DIAGNOSIS — Z1231 Encounter for screening mammogram for malignant neoplasm of breast: Secondary | ICD-10-CM

## 2021-12-10 ENCOUNTER — Ambulatory Visit (INDEPENDENT_AMBULATORY_CARE_PROVIDER_SITE_OTHER): Payer: Medicare Other | Admitting: Dermatology

## 2021-12-10 ENCOUNTER — Encounter: Payer: Self-pay | Admitting: Dermatology

## 2021-12-10 DIAGNOSIS — L308 Other specified dermatitis: Secondary | ICD-10-CM

## 2021-12-10 DIAGNOSIS — L409 Psoriasis, unspecified: Secondary | ICD-10-CM

## 2021-12-10 DIAGNOSIS — L570 Actinic keratosis: Secondary | ICD-10-CM

## 2021-12-10 DIAGNOSIS — D492 Neoplasm of unspecified behavior of bone, soft tissue, and skin: Secondary | ICD-10-CM

## 2021-12-10 DIAGNOSIS — L578 Other skin changes due to chronic exposure to nonionizing radiation: Secondary | ICD-10-CM

## 2021-12-10 DIAGNOSIS — Z85828 Personal history of other malignant neoplasm of skin: Secondary | ICD-10-CM

## 2021-12-10 MED ORDER — MOMETASONE FUROATE 0.1 % EX CREA: TOPICAL_CREAM | Freq: Every day | CUTANEOUS | 6 refills | Status: DC

## 2021-12-10 MED ORDER — MOMETASONE FUROATE 0.1 % EX SOLN: Freq: Every day | CUTANEOUS | 6 refills | Status: AC

## 2021-12-10 NOTE — Progress Notes (Signed)
Follow-Up Visit   Subjective  Lori Dalton is a 81 y.o. female who presents for the following: Psoriasis (3 month recheck. B/L legs. Using Zoryve as directed, almost out of trade size sample, needs more samples. Needs refill of Mometasone cream and lotion) and lesion (Recheck left lower leg. Hx of SCC, Tx with [facility] 07/2021. Thinks has new skin cancer or may be recurrence of SCC). The patient has spots, moles and lesions to be evaluated, some may be new or changing and the patient has concerns that these could be cancer.  The following portions of the chart were reviewed this encounter and updated as appropriate:  Allergies  Meds  Problems  Med Hx  Surg Hx  Fam Hx     Review of Systems: No other skin or systemic complaints except as noted in HPI or Assessment and Plan.  Objective  Well appearing patient in no apparent distress; mood and affect are within normal limits.  A focused examination was performed including face, extremities. Relevant physical exam findings are noted in the Assessment and Plan.  Right Thigh - Anterior Well-demarcated erythematous papules/plaques with silvery scale, guttate pink scaly papules.   right medial pretibial superior to SCC scar 1.4 cm erythematous nodule      Assessment & Plan   History of Squamous Cell Carcinoma of the Skin. Left pretibial. EDC 09/03/2021. - No evidence of recurrence today - No lymphadenopathy - Recommend regular full body skin exams - Recommend daily broad spectrum sunscreen SPF 30+ to sun-exposed areas, reapply every 2 hours as needed.  - Call if any new or changing lesions are noted between office visits  Psoriasis Legs Chronic and persistent condition with duration or expected duration over one year. Condition is symptomatic / bothersome to patient. Not to goal. Psoriasis is a chronic non-curable, but treatable genetic/hereditary disease that may have other systemic features affecting other organ systems such as  joints (Psoriatic Arthritis). It is associated with an increased risk of inflammatory bowel disease, heart disease, non-alcoholic fatty liver disease, and depression.    Continue Zoryve cream as directed.  Continue Mometasone cream/lotion as directed/as needed.   Topical steroids (such as triamcinolone, fluocinolone, fluocinonide, mometasone, clobetasol, halobetasol, betamethasone, hydrocortisone) can cause thinning and lightening of the skin if they are used for too long in the same area. Your physician has selected the right strength medicine for your problem and area affected on the body. Please use your medication only as directed by your physician to prevent side effects.   Related Medications Roflumilast (ZORYVE) 0.3 % CREA Apply 1 application topically daily. Qd to aa psoriasis mometasone (ELOCON) 0.1 % lotion Apply topically daily. Use up to 5 days a week  Neoplasm of skin right medial pretibial superior to SCC scar Epidermal / dermal shaving  Lesion diameter (cm):  1.4 Informed consent: discussed and consent obtained   Timeout: patient name, date of birth, surgical site, and procedure verified   Procedure prep:  Patient was prepped and draped in usual sterile fashion Prep type:  Isopropyl alcohol Anesthesia: the lesion was anesthetized in a standard fashion   Anesthetic:  1% lidocaine w/ epinephrine 1-100,000 buffered w/ 8.4% NaHCO3 Instrument used: flexible razor blade   Hemostasis achieved with: pressure, aluminum chloride and electrodesiccation   Outcome: patient tolerated procedure well   Post-procedure details: sterile dressing applied and wound care instructions given   Dressing type: bandage and petrolatum    Destruction of lesion Complexity: extensive   Destruction method: electrodesiccation and curettage  Informed consent: discussed and consent obtained   Timeout:  patient name, date of birth, surgical site, and procedure verified Procedure prep:  Patient was  prepped and draped in usual sterile fashion Prep type:  Isopropyl alcohol Anesthesia: the lesion was anesthetized in a standard fashion   Anesthetic:  1% lidocaine w/ epinephrine 1-100,000 buffered w/ 8.4% NaHCO3 Curettage performed in three different directions: Yes   Electrodesiccation performed over the curetted area: Yes   Curettage cycles:  3 Lesion length (cm):  1.4 Lesion width (cm):  1.4 Margin per side (cm):  0.2 Final wound size (cm):  1.8 Hemostasis achieved with:  pressure and aluminum chloride Outcome: patient tolerated procedure well with no complications   Post-procedure details: sterile dressing applied and wound care instructions given   Dressing type: bandage and petrolatum    Specimen 1 - Surgical pathology Differential Diagnosis: SCC Check Margins: No  Actinic Damage - chronic, secondary to cumulative UV radiation exposure/sun exposure over time - diffuse scaly erythematous macules with underlying dyspigmentation - Recommend daily broad spectrum sunscreen SPF 30+ to sun-exposed areas, reapply every 2 hours as needed.  - Recommend staying in the shade or wearing long sleeves, sun glasses (UVA+UVB protection) and wide brim hats (4-inch brim around the entire circumference of the hat). - Call for new or changing lesions.  Return for Psoriasis Follow Up 2-3 months.  I, Emelia Salisbury, CMA, am acting as scribe for Sarina Ser, MD. Documentation: I have reviewed the above documentation for accuracy and completeness, and I agree with the above.  Sarina Ser, MD

## 2021-12-10 NOTE — Patient Instructions (Addendum)
Electrodesiccation and Curettage ("Scrape and Burn") Wound Care Instructions  Leave the original bandage on for 24 hours if possible.  If the bandage becomes soaked or soiled before that time, it is OK to remove it and examine the wound.  A small amount of post-operative bleeding is normal.  If excessive bleeding occurs, remove the bandage, place gauze over the site and apply continuous pressure (no peeking) over the area for 30 minutes. If this does not work, please call our clinic as soon as possible or page your doctor if it is after hours.   Once a day, cleanse the wound with soap and water. It is fine to shower. If a thick crust develops you may use a Q-tip dipped into dilute hydrogen peroxide (mix 1:1 with water) to dissolve it.  Hydrogen peroxide can slow the healing process, so use it only as needed.    After washing, apply petroleum jelly (Vaseline) or an antibiotic ointment if your doctor prescribed one for you, followed by a bandage.    For best healing, the wound should be covered with a layer of ointment at all times. If you are not able to keep the area covered with a bandage to hold the ointment in place, this may mean re-applying the ointment several times a day.  Continue this wound care until the wound has healed and is no longer open. It may take several weeks for the wound to heal and close.  Itching and mild discomfort is normal during the healing process.  If you have any discomfort, you can take Tylenol (acetaminophen) or ibuprofen as directed on the bottle. (Please do not take these if you have an allergy to them or cannot take them for another reason).  Some redness, tenderness and white or yellow material in the wound is normal healing.  If the area becomes very sore and red, or develops a thick yellow-green material (pus), it may be infected; please notify us.    Wound healing continues for up to one year following surgery. It is not unusual to experience pain in the scar  from time to time during the interval.  If the pain becomes severe or the scar thickens, you should notify the office.    A slight amount of redness in a scar is expected for the first six months.  After six months, the redness will fade and the scar will soften and fade.  The color difference becomes less noticeable with time.  If there are any problems, return for a post-op surgery check at your earliest convenience.  To improve the appearance of the scar, you can use silicone scar gel, cream, or sheets (such as Mederma or Serica) every night for up to one year. These are available over the counter (without a prescription).  Please call our office at [phone] for any questions or concerns.   Continue Zoryve cream as directed.   Continue Mometasone cream/lotion as directed/as needed.    Topical steroids (such as triamcinolone, fluocinolone, fluocinonide, mometasone, clobetasol, halobetasol, betamethasone, hydrocortisone) can cause thinning and lightening of the skin if they are used for too long in the same area. Your physician has selected the right strength medicine for your problem and area affected on the body. Please use your medication only as directed by your physician to prevent side effects.     If You Need Anything After Your Visit  If you have any questions or concerns for your doctor, please call our main line at [phone] and press  option 4 to reach your doctor's medical assistant. If no one answers, please leave a voicemail as directed and we will return your call as soon as possible. Messages left after 4 pm will be answered the following business day.   You may also send [place] a message via [place]. We typically respond to MyChart messages within 1-2 business days.  For prescription refills, please ask your pharmacy to contact our office. Our fax number is [phone].  If you have an urgent issue when the clinic is closed that cannot wait until the next business day,  you can page your doctor at the number below.    Please note that while we do our best to be available for urgent issues outside of office hours, we are not available 24/7.   If you have an urgent issue and are unable to reach [place], you may choose to seek medical care at your doctor's office, retail clinic, urgent care center, or emergency room.  If you have a medical emergency, please immediately call 911 or go to the emergency department.  Pager Numbers  - Dr. Nehemiah Massed: [phone]  - Dr. Laurence Ferrari: [phone]  - Dr. Nicole Kindred: [phone]  In the event of inclement weather, please call our main line at [phone] for an update on the status of any delays or closures.  Dermatology Medication Tips: Please keep the boxes that topical medications come in in order to help keep track of the instructions about where and how to use these. Pharmacies typically print the medication instructions only on the boxes and not directly on the medication tubes.   If your medication is too expensive, please contact our office at [phone] option 4 or send [place] a message through [place].   We are unable to tell what your co-pay for medications will be in advance as this is different depending on your insurance coverage. However, we may be able to find a substitute medication at lower cost or fill out paperwork to get insurance to cover a needed medication.   If a prior authorization is required to get your medication covered by your insurance company, please allow [place] 1-2 business days to complete this process.  Drug prices often vary depending on where the prescription is filled and some pharmacies may offer cheaper prices.  The website www.goodrx.com contains coupons for medications through different pharmacies. The prices here do not account for what the cost may be with help from insurance (it may be cheaper with your insurance), but the website can give you the price if you did not use any insurance.   - You can print the associated coupon and take it with your prescription to the pharmacy.  - You may also stop by our office during regular business hours and pick up a GoodRx coupon card.  - If you need your prescription sent electronically to a different pharmacy, notify our office through [facility] or by phone at [phone] option 4.     Si Usted Necesita Algo Despus de Su Visita  Tambin puede enviarnos un mensaje a travs de Pharmacist, community. Por lo general respondemos a los mensajes de MyChart en el transcurso de 1 a 2 das hbiles.  Para renovar recetas, por favor pida a su farmacia que se ponga en contacto con nuestra oficina. Harland Dingwall de fax es [place] [phone].  Si tiene un asunto urgente cuando la clnica est cerrada y que no puede esperar hasta el siguiente da hbil, puede llamar/localizar a su doctor(a) al [employer]  aparece a continuacin.   Por favor, tenga en cuenta que aunque hacemos todo lo posible para estar disponibles para asuntos urgentes fuera del horario de [place], no estamos disponibles las 24 horas del da, los 7 das de la [place].   Si tiene un problema urgente y no puede comunicarse con nosotros, puede optar por buscar atencin mdica  en el consultorio de su doctor(a), en una clnica privada, en un centro de atencin urgente o en una sala de emergencias.  Si tiene Engineering geologist, por favor llame inmediatamente al 911 o vaya a la sala de emergencias.  Nmeros de bper  - Dr. Nehemiah Massed: [phone]  - Dra. Moye: [phone]  - Dra. Nicole Kindred: [phone]  En caso de inclemencias del [place], por favor llame a Johnsie Kindred principal al [phone] para una actualizacin sobre el [place] de cualquier retraso o cierre.  Consejos para la medicacin en dermatologa: Por favor, guarde las cajas en las que vienen los medicamentos de uso tpico para ayudarle a seguir las instrucciones sobre dnde y cmo usarlos. Las farmacias generalmente  imprimen las instrucciones del medicamento slo en las cajas y no directamente en los tubos del [place].   Si su medicamento es muy caro, por favor, pngase en contacto con Zigmund Daniel llamando al [phone] y presione la opcin 4 o envenos un mensaje a travs de Pharmacist, community.   No podemos decirle cul ser su copago por los medicamentos por adelantado ya que esto es diferente dependiendo de la cobertura de su seguro. Sin embargo, es posible que podamos encontrar un medicamento sustituto a Electrical engineer un formulario para que el seguro cubra el medicamento que se considera necesario.   Si se requiere una autorizacin previa para que su compaa de seguros [place] su medicamento, por favor permtanos de 1 a 2 das hbiles para completar este proceso.  Los precios de los medicamentos varan con frecuencia dependiendo del Environmental consultant de dnde se surte la receta y alguna farmacias pueden ofrecer precios ms baratos.  El sitio web www.goodrx.com tiene cupones para medicamentos de Airline pilot. Los precios aqu no tienen en cuenta lo que podra costar con la ayuda del seguro (puede ser ms barato con su seguro), pero el sitio web puede darle el precio si no utiliz Research scientist (physical sciences).  - Puede imprimir el cupn correspondiente y llevarlo con su receta a la farmacia.  - Tambin puede pasar por nuestra oficina durante el horario de atencin regular y Charity fundraiser una tarjeta de cupones de GoodRx.  - Si necesita que su receta se enve electrnicamente a una farmacia diferente, informe a nuestra oficina a travs de MyChart de [place] o por telfono llamando al [phone] y presione la opcin 4.

## 2021-12-11 ENCOUNTER — Telehealth: Payer: Self-pay

## 2021-12-11 NOTE — Telephone Encounter (Signed)
-----   Message from Ralene Bathe, MD sent at 12/11/2021  2:16 PM EDT ----- Diagnosis Skin , right medial pretibial superior to scc scar ACTINIC KERATOSIS WITH SUPERIMPOSED SPONGIOTIC DERMATITIS  PreCancer with inflammation Already treated Recheck next visit

## 2021-12-11 NOTE — Telephone Encounter (Signed)
Left message on machine to return my call. 

## 2021-12-12 ENCOUNTER — Telehealth: Payer: Self-pay

## 2021-12-12 NOTE — Telephone Encounter (Signed)
-----   Message from Ralene Bathe, MD sent at 12/11/2021  2:16 PM EDT ----- Diagnosis Skin , right medial pretibial superior to scc scar ACTINIC KERATOSIS WITH SUPERIMPOSED SPONGIOTIC DERMATITIS  PreCancer with inflammation Already treated Recheck next visit

## 2021-12-12 NOTE — Telephone Encounter (Signed)
Patient advised. aw 

## 2021-12-16 ENCOUNTER — Encounter: Payer: Self-pay | Admitting: Dermatology

## 2021-12-20 ENCOUNTER — Ambulatory Visit
Admission: RE | Admit: 2021-12-20 | Discharge: 2021-12-20 | Disposition: A | Discharge status: Home / Self Care | Payer: Medicare Other | Source: Ambulatory Visit | Attending: Family Medicine | Admitting: Family Medicine

## 2021-12-20 DIAGNOSIS — Z1231 Encounter for screening mammogram for malignant neoplasm of breast: Secondary | ICD-10-CM | POA: Insufficient documentation

## 2022-03-04 ENCOUNTER — Ambulatory Visit (INDEPENDENT_AMBULATORY_CARE_PROVIDER_SITE_OTHER): Payer: Medicare Other | Admitting: Dermatology

## 2022-03-04 ENCOUNTER — Encounter: Payer: Self-pay | Admitting: Dermatology

## 2022-03-04 DIAGNOSIS — L578 Other skin changes due to chronic exposure to nonionizing radiation: Secondary | ICD-10-CM

## 2022-03-04 DIAGNOSIS — L409 Psoriasis, unspecified: Secondary | ICD-10-CM

## 2022-03-04 DIAGNOSIS — K130 Diseases of lips: Secondary | ICD-10-CM | POA: Diagnosis not present

## 2022-03-04 DIAGNOSIS — D692 Other nonthrombocytopenic purpura: Secondary | ICD-10-CM

## 2022-03-04 DIAGNOSIS — K111 Hypertrophy of salivary gland: Secondary | ICD-10-CM

## 2022-03-04 DIAGNOSIS — Z85828 Personal history of other malignant neoplasm of skin: Secondary | ICD-10-CM

## 2022-03-04 MED ORDER — TACROLIMUS 0.1 % EX OINT: TOPICAL_OINTMENT | CUTANEOUS | 2 refills | Status: AC

## 2022-03-04 NOTE — Patient Instructions (Addendum)
Mometasone 1-2 times daily 3-4 days a week as needed.  Zoryve 1-2 times daily 7 days a week  Lips:  Start Tacrolimus ointment twice daily as needed to lips   Topical steroids (such as triamcinolone, fluocinolone, fluocinonide, mometasone, clobetasol, halobetasol, betamethasone, hydrocortisone) can cause thinning and lightening of the skin if they are used for too long in the same area. Your physician has selected the right strength medicine for your problem and area affected on the body. Please use your medication only as directed by your physician to prevent side effects.     Due to recent changes in healthcare laws, you may see results of your pathology and/or laboratory studies on MyChart before the doctors have had a chance to review them. We understand that in some cases there may be results that are confusing or concerning to you. Please understand that not all results are received at the same time and often the doctors may need to interpret multiple results in order to provide you with the best plan of care or course of treatment. Therefore, we ask that you please give [place] 2 business days to thoroughly review all your results before contacting the office for clarification. Should we see a critical lab result, you will be contacted sooner.   If You Need Anything After Your Visit  If you have any questions or concerns for your doctor, please call our main line at [phone] and press option 4 to reach your doctor's medical assistant. If no one answers, please leave a voicemail as directed and we will return your call as soon as possible. Messages left after 4 pm will be answered the following business day.   You may also send [place] a message via [place]. We typically respond to MyChart messages within 1-2 business days.  For prescription refills, please ask your pharmacy to contact our office. Our fax number is [phone].  If you have an urgent issue when the clinic is closed that cannot  wait until the next business day, you can page your doctor at the number below.    Please note that while we do our best to be available for urgent issues outside of office hours, we are not available 24/7.   If you have an urgent issue and are unable to reach [place], you may choose to seek medical care at your doctor's office, retail clinic, urgent care center, or emergency room.  If you have a medical emergency, please immediately call 911 or go to the emergency department.  Pager Numbers  - Dr. Nehemiah Massed: [phone]  - Dr. Laurence Ferrari: [phone]  - Dr. Nicole Kindred: [phone]  In the event of inclement weather, please call our main line at [phone] for an update on the status of any delays or closures.  Dermatology Medication Tips: Please keep the boxes that topical medications come in in order to help keep track of the instructions about where and how to use these. Pharmacies typically print the medication instructions only on the boxes and not directly on the medication tubes.   If your medication is too expensive, please contact our office at [phone] option 4 or send [place] a message through [place].   We are unable to tell what your co-pay for medications will be in advance as this is different depending on your insurance coverage. However, we may be able to find a substitute medication at lower cost or fill out paperwork to get insurance to cover a needed medication.   If a prior authorization is required  to get your medication covered by your insurance company, please allow [place] 1-2 business days to complete this process.  Drug prices often vary depending on where the prescription is filled and some pharmacies may offer cheaper prices.  The website www.goodrx.com contains coupons for medications through different pharmacies. The prices here do not account for what the cost may be with help from insurance (it may be cheaper with your insurance), but the website can give you the price  if you did not use any insurance.  - You can print the associated coupon and take it with your prescription to the pharmacy.  - You may also stop by our office during regular business hours and pick up a GoodRx coupon card.  - If you need your prescription sent electronically to a different pharmacy, notify our office through [facility] or by phone at [phone] option 4.     Si Usted Necesita Algo Despus de Su Visita  Tambin puede enviarnos un mensaje a travs de Pharmacist, community. Por lo general respondemos a los mensajes de MyChart en el transcurso de 1 a 2 das hbiles.  Para renovar recetas, por favor pida a su farmacia que se ponga en contacto con nuestra oficina. Harland Dingwall de fax es [place] [phone].  Si tiene un asunto urgente cuando la clnica est cerrada y que no puede esperar hasta el siguiente da hbil, puede llamar/localizar a su doctor(a) al nmero que aparece a continuacin.   Por favor, tenga en cuenta que aunque hacemos todo lo posible para estar disponibles para asuntos urgentes fuera del horario de [place], no estamos disponibles las 24 horas del da, los 7 das de la [place].   Si tiene un problema urgente y no puede comunicarse con nosotros, puede optar por buscar atencin mdica  en el consultorio de su doctor(a), en una clnica privada, en un centro de atencin urgente o en una sala de emergencias.  Si tiene Engineering geologist, por favor llame inmediatamente al 911 o vaya a la sala de emergencias.  Nmeros de bper  - Dr. Nehemiah Massed: [phone]  - Dra. Moye: [phone]  - Dra. Nicole Kindred: [phone]  En caso de inclemencias del [place], por favor llame a Johnsie Kindred principal al [phone] para una actualizacin sobre el [place] de cualquier retraso o cierre.  Consejos para la medicacin en dermatologa: Por favor, guarde las cajas en las que vienen los medicamentos de uso tpico para ayudarle a seguir las instrucciones sobre dnde y cmo usarlos.  Las farmacias generalmente imprimen las instrucciones del medicamento slo en las cajas y no directamente en los tubos del [place].   Si su medicamento es muy caro, por favor, pngase en contacto con Zigmund Daniel llamando al [phone] y presione la opcin 4 o envenos un mensaje a travs de Pharmacist, community.   No podemos decirle cul ser su copago por los medicamentos por adelantado ya que esto es diferente dependiendo de la cobertura de su seguro. Sin embargo, es posible que podamos encontrar un medicamento sustituto a Electrical engineer un formulario para que el seguro cubra el medicamento que se considera necesario.   Si se requiere una autorizacin previa para que su compaa de seguros [place] su medicamento, por favor permtanos de 1 a 2 das hbiles para completar este proceso.  Los precios de los medicamentos varan con frecuencia dependiendo del Environmental consultant de dnde se surte la receta y alguna farmacias pueden ofrecer precios ms baratos.  El sitio web www.goodrx.com tiene cupones para medicamentos de [employer]  farmacias. Los precios aqu no tienen en cuenta lo que podra costar con la ayuda del seguro (puede ser ms barato con su seguro), pero el sitio web puede darle el precio si no utiliz Research scientist (physical sciences).  - Puede imprimir el cupn correspondiente y llevarlo con su receta a la farmacia.  - Tambin puede pasar por nuestra oficina durante el horario de atencin regular y Charity fundraiser una tarjeta de cupones de GoodRx.  - Si necesita que su receta se enve electrnicamente a una farmacia diferente, informe a nuestra oficina a travs de MyChart de [place] o por telfono llamando al [phone] y presione la opcin 4.

## 2022-03-04 NOTE — Progress Notes (Unsigned)
   Follow-Up Visit   Subjective  ALLANNAH KEMPEN is a 81 y.o. female who presents for the following: Psoriasis (3 month recheck. Using Zoryve and Mometasone as directed. Patient reports legs are doing well. Check lesion on lower lip >1 month). The patient has spots, moles and lesions to be evaluated, some may be new or changing and the patient has concerns that these could be cancer.  The following portions of the chart were reviewed this encounter and updated as appropriate:  Allergies  Meds  Problems  Med Hx  Surg Hx  Fam Hx     Review of Systems: No other skin or systemic complaints except as noted in HPI or Assessment and Plan.  Objective  Well appearing patient in no apparent distress; mood and affect are within normal limits.  A focused examination was performed including face, legs. Relevant physical exam findings are noted in the Assessment and Plan.  Lips Erythema with edema  inner lower lip Edematous papule  Left Lower Leg - Anterior Mild pink patches   Assessment & Plan   History of Squamous Cell Carcinoma of the Skin - No evidence of recurrence today - No lymphadenopathy - Recommend regular full body skin exams - Recommend daily broad spectrum sunscreen SPF 30+ to sun-exposed areas, reapply every 2 hours as needed.  - Call if any new or changing lesions are noted between office visits  Purpura - Chronic; persistent and recurrent.  Treatable, but not curable. - Violaceous macules and patches - Benign - Related to trauma, age, sun damage and/or use of blood thinners, chronic use of topical and/or oral steroids - Observe - Can use OTC arnica containing moisturizer such as Dermend Bruise Formula if desired - Call for worsening or other concerns  Cheilitis Lips Start Tacrolimus ointment twice daily as needed to lips tacrolimus (PROTOPIC) 0.1 % ointment - Lips Apply twice daily to lips as needed  Enlarged salivary gland inner lower lip Observe for  changes.  Consider removal if does not resolve on its own.  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 Condition is symptomatic / bothersome to patient. Not to goal but much improved today.. Psoriasis is a chronic non-curable, but treatable genetic/hereditary disease that may have other systemic features affecting other organ systems such as joints (Psoriatic Arthritis). It is associated with an increased risk of inflammatory bowel disease, heart disease, non-alcoholic fatty liver disease, and depression.    Mometasone 1-2 times daily 3-4 days a week as needed. Zoryve 1-2 times daily 7 days a week  Related Medications Roflumilast (ZORYVE) 0.3 % CREA Apply 1 application topically daily. Qd to aa psoriasis mometasone (ELOCON) 0.1 % lotion Apply topically daily. Use up to 5 days a week  Actinic Damage - chronic, secondary to cumulative UV radiation exposure/sun exposure over time - diffuse scaly erythematous macules with underlying dyspigmentation - Recommend daily broad spectrum sunscreen SPF 30+ to sun-exposed areas, reapply every 2 hours as needed.  - Recommend staying in the shade or wearing long sleeves, sun glasses (UVA+UVB protection) and wide brim hats (4-inch brim around the entire circumference of the hat). - Call for new or changing lesions.  Return in about 6 months (around 09/04/2022) for TBSE, Psoriasis Follow Up. Documentation: I have reviewed the above documentation for accuracy and completeness, and I agree with the above.  Sarina Ser, MD

## 2022-03-05 ENCOUNTER — Encounter: Payer: Self-pay | Admitting: Dermatology

## 2022-04-30 ENCOUNTER — Ambulatory Visit (INDEPENDENT_AMBULATORY_CARE_PROVIDER_SITE_OTHER): Payer: Medicare Other | Admitting: Dermatology

## 2022-04-30 DIAGNOSIS — C44722 Squamous cell carcinoma of skin of right lower limb, including hip: Secondary | ICD-10-CM | POA: Diagnosis not present

## 2022-04-30 DIAGNOSIS — D099 Carcinoma in situ, unspecified: Secondary | ICD-10-CM

## 2022-04-30 DIAGNOSIS — D492 Neoplasm of unspecified behavior of bone, soft tissue, and skin: Secondary | ICD-10-CM

## 2022-04-30 DIAGNOSIS — R21 Rash and other nonspecific skin eruption: Secondary | ICD-10-CM

## 2022-04-30 DIAGNOSIS — D0471 Carcinoma in situ of skin of right lower limb, including hip: Secondary | ICD-10-CM

## 2022-04-30 HISTORY — DX: Carcinoma in situ, unspecified: D09.9

## 2022-04-30 MED ORDER — HYDROCORTISONE 2.5 % EX CREA: TOPICAL_CREAM | CUTANEOUS | 2 refills | Status: AC

## 2022-04-30 NOTE — Progress Notes (Addendum)
Follow-Up Visit   Subjective  Lori Dalton is a 81 y.o. female who presents for the following: Skin Problem (The patient has spots, moles and lesions to be evaluated, some may be new or changing and the patient has concerns that these could be cancer. ). Check scaly area on the lower lip, treating with Protopic ointment with a poor response.     The following portions of the chart were reviewed this encounter and updated as appropriate:   Allergies  Meds  Problems  Med Hx  Surg Hx  Fam Hx      Review of Systems:  No other skin or systemic complaints except as noted in HPI or Assessment and Plan.  Objective  Well appearing patient in no apparent distress; mood and affect are within normal limits.  A focused examination was performed including legs . Relevant physical exam findings are noted in the Assessment and Plan.  right pretibia superior 0.5 cm keratotic papule       right pretibia inferior 0.8 cm keratotic papule        Right ankle 1.1 cm keratotic papule        lower lip Lower lip scaly with some edema, no firm papules or plaques    Assessment & Plan  Neoplasm of skin (3) right pretibia superior  Epidermal / dermal shaving  Lesion diameter (cm):  0.5 Informed consent: discussed and consent obtained   Timeout: patient name, date of birth, surgical site, and procedure verified   Procedure prep:  Patient was prepped and draped in usual sterile fashion Prep type:  Isopropyl alcohol Anesthesia: the lesion was anesthetized in a standard fashion   Anesthetic:  1% lidocaine w/ epinephrine 1-100,000 buffered w/ 8.4% NaHCO3 Hemostasis achieved with: pressure, aluminum chloride and electrodesiccation   Outcome: patient tolerated procedure well   Post-procedure details: sterile dressing applied and wound care instructions given   Dressing type: bandage and petrolatum    Destruction of lesion  Destruction method: electrodesiccation and  curettage   Informed consent: discussed and consent obtained   Timeout:  patient name, date of birth, surgical site, and procedure verified Anesthesia: the lesion was anesthetized in a standard fashion   Anesthetic:  1% lidocaine w/ epinephrine 1-100,000 buffered w/ 8.4% NaHCO3 Curettage performed in three different directions: Yes   Electrodesiccation performed over the curetted area: Yes   Curettage cycles:  3 Final wound size (cm):  0.9 Hemostasis achieved with:  electrodesiccation Outcome: patient tolerated procedure well with no complications   Post-procedure details: sterile dressing applied and wound care instructions given   Dressing type: petrolatum    Specimen 1 - Surgical pathology Differential Diagnosis: R/O SCC   Check Margins: No  right pretibia inferior  Epidermal / dermal shaving  Lesion diameter (cm):  0.8 Informed consent: discussed and consent obtained   Timeout: patient name, date of birth, surgical site, and procedure verified   Procedure prep:  Patient was prepped and draped in usual sterile fashion Prep type:  Isopropyl alcohol Anesthesia: the lesion was anesthetized in a standard fashion   Anesthetic:  1% lidocaine w/ epinephrine 1-100,000 buffered w/ 8.4% NaHCO3 Hemostasis achieved with: pressure, aluminum chloride and electrodesiccation   Outcome: patient tolerated procedure well   Post-procedure details: sterile dressing applied and wound care instructions given   Dressing type: bandage and petrolatum    Destruction of lesion  Destruction method: electrodesiccation and curettage   Informed consent: discussed and consent obtained   Timeout:  patient name, date  of birth, surgical site, and procedure verified Anesthesia: the lesion was anesthetized in a standard fashion   Anesthetic:  1% lidocaine w/ epinephrine 1-100,000 buffered w/ 8.4% NaHCO3 Curettage performed in three different directions: Yes   Electrodesiccation performed over the curetted  area: Yes   Curettage cycles:  3 Final wound size (cm):  1.1 Hemostasis achieved with:  electrodesiccation Outcome: patient tolerated procedure well with no complications   Post-procedure details: sterile dressing applied and wound care instructions given   Dressing type: petrolatum    Specimen 2 - Surgical pathology Differential Diagnosis: R/O SCC   Check Margins: No  Right ankle  Epidermal / dermal shaving  Lesion diameter (cm):  1.1 Informed consent: discussed and consent obtained   Timeout: patient name, date of birth, surgical site, and procedure verified   Procedure prep:  Patient was prepped and draped in usual sterile fashion Prep type:  Isopropyl alcohol Anesthesia: the lesion was anesthetized in a standard fashion   Anesthetic:  1% lidocaine w/ epinephrine 1-100,000 buffered w/ 8.4% NaHCO3 Hemostasis achieved with: pressure, aluminum chloride and electrodesiccation   Outcome: patient tolerated procedure well   Post-procedure details: sterile dressing applied and wound care instructions given   Dressing type: bandage and petrolatum    Destruction of lesion  Destruction method: electrodesiccation and curettage   Informed consent: discussed and consent obtained   Timeout:  patient name, date of birth, surgical site, and procedure verified Anesthesia: the lesion was anesthetized in a standard fashion   Anesthetic:  1% lidocaine w/ epinephrine 1-100,000 buffered w/ 8.4% NaHCO3 Curettage performed in three different directions: Yes   Electrodesiccation performed over the curetted area: Yes   Curettage cycles:  3 Final wound size (cm):  1.3 Hemostasis achieved with:  electrodesiccation Outcome: patient tolerated procedure well with no complications   Post-procedure details: sterile dressing applied and wound care instructions given   Dressing type: petrolatum    Specimen 3 - Surgical pathology Differential Diagnosis: R/O SCC   Check Margins: No  Rash and other  nonspecific skin eruption lower lip  Actinic cheilitis vs contact dermatitis vs other  Avoid Burt's Bees lip balm d/t risk of propolis allergy.  Start Hydrocortisone 2.5% cream apply to affected skin bid x 2 weeks then stop  Patient declines LN2 and topical 5FU treatment today Can try over the counter doctor rogers Restore Healing Balm or plain Vaseline   Recommend daily broad spectrum sunscreen SPF 30+ to sun-exposed areas, reapply every 2 hours as needed.  Recommend staying in the shade or wearing long sleeves, sun glasses (UVA+UVB protection) and wide brim hats (4-inch brim around the entire circumference of the hat). Call for new or changing lesions.   Call for changes. Recheck at follow-up.  Related Medications hydrocortisone 2.5 % cream Apply to affected lip twice a day for 2 weeks, then stop   Purpura - Chronic; persistent and recurrent.  Treatable, but not curable. Right lower leg - Violaceous macules and patches - Benign - Related to trauma, age, sun damage and/or use of blood thinners, chronic use of topical and/or oral steroids - Observe - Can use OTC arnica containing moisturizer such as Dermend Bruise Formula if desired - Call for worsening or other concerns   Return for as scheduled in April 2024.  I, Marye Round, CMA, am acting as scribe for Forest Gleason, MD .   Documentation: I have reviewed the above documentation for accuracy and completeness, and I agree with the above.  Forest Gleason, MD

## 2022-04-30 NOTE — Patient Instructions (Addendum)
Wound Care Instructions  Cleanse wound gently with soap and water once a day then pat dry with clean gauze. Apply a thin coat of Petrolatum (petroleum jelly, "Vaseline") over the wound (unless you have an allergy to this). We recommend that you use a new, sterile tube of Vaseline. Do not pick or remove scabs. Do not remove the yellow or white "healing tissue" from the base of the wound.  Cover the wound with fresh, clean, nonstick gauze and secure with paper tape. You may use Band-Aids in place of gauze and tape if the wound is small enough, but would recommend trimming much of the tape off as there is often too much. Sometimes Band-Aids can irritate the skin.  You should call the office for your biopsy report after 1 week if you have not already been contacted.  If you experience any problems, such as abnormal amounts of bleeding, swelling, significant bruising, significant pain, or evidence of infection, please call the office immediately.  FOR ADULT SURGERY PATIENTS: If you need something for pain relief you may take 1 extra strength Tylenol (acetaminophen) AND 2 Ibuprofen ('200mg'$  each) together every 4 hours as needed for pain. (do not take these if you are allergic to them or if you have a reason you should not take them.) Typically, you may only need pain medication for 1 to 3 days.    Purpura - Chronic; persistent and recurrent.  Treatable, but not curable. - Violaceous macules and patches - Benign - Related to trauma, age, sun damage and/or use of blood thinners, chronic use of topical and/or oral steroids - Observe - Can use OTC arnica containing moisturizer such as Dermend Bruise Formula if desired - Call for worsening or other concerns    Due to recent changes in healthcare laws, you may see results of your pathology and/or laboratory studies on MyChart before the doctors have had a chance to review them. We understand that in some cases there may be results that are confusing or  concerning to you. Please understand that not all results are received at the same time and often the doctors may need to interpret multiple results in order to provide you with the best plan of care or course of treatment. Therefore, we ask that you please give [place] 2 business days to thoroughly review all your results before contacting the office for clarification. Should we see a critical lab result, you will be contacted sooner.   If You Need Anything After Your Visit  If you have any questions or concerns for your doctor, please call our main line at [phone] and press option 4 to reach your doctor's medical assistant. If no one answers, please leave a voicemail as directed and we will return your call as soon as possible. Messages left after 4 pm will be answered the following business day.   You may also send [place] a message via [place]. We typically respond to MyChart messages within 1-2 business days.  For prescription refills, please ask your pharmacy to contact our office. Our fax number is [phone].  If you have an urgent issue when the clinic is closed that cannot wait until the next business day, you can page your doctor at the number below.    Please note that while we do our best to be available for urgent issues outside of office hours, we are not available 24/7.   If you have an urgent issue and are unable to reach [place], you may choose to  seek medical care at your doctor's office, retail clinic, urgent care center, or emergency room.  If you have a medical emergency, please immediately call 911 or go to the emergency department.  Pager Numbers  - Dr. Nehemiah Massed: [phone]  - Dr. Laurence Ferrari: [phone]  - Dr. Nicole Kindred: [phone]  In the event of inclement weather, please call our main line at [phone] for an update on the status of any delays or closures.  Dermatology Medication Tips: Please keep the boxes that topical medications come in in order to help keep track  of the instructions about where and how to use these. Pharmacies typically print the medication instructions only on the boxes and not directly on the medication tubes.   If your medication is too expensive, please contact our office at [phone] option 4 or send [place] a message through [place].   We are unable to tell what your co-pay for medications will be in advance as this is different depending on your insurance coverage. However, we may be able to find a substitute medication at lower cost or fill out paperwork to get insurance to cover a needed medication.   If a prior authorization is required to get your medication covered by your insurance company, please allow [place] 1-2 business days to complete this process.  Drug prices often vary depending on where the prescription is filled and some pharmacies may offer cheaper prices.  The website www.goodrx.com contains coupons for medications through different pharmacies. The prices here do not account for what the cost may be with help from insurance (it may be cheaper with your insurance), but the website can give you the price if you did not use any insurance.  - You can print the associated coupon and take it with your prescription to the pharmacy.  - You may also stop by our office during regular business hours and pick up a GoodRx coupon card.  - If you need your prescription sent electronically to a different pharmacy, notify our office through [facility] or by phone at [phone] option 4.     Si Usted Necesita Algo Despus de Su Visita  Tambin puede enviarnos un mensaje a travs de Pharmacist, community. Por lo general respondemos a los mensajes de MyChart en el transcurso de 1 a 2 das hbiles.  Para renovar recetas, por favor pida a su farmacia que se ponga en contacto con nuestra oficina. Harland Dingwall de fax es [place] [phone].  Si tiene un asunto urgente cuando la clnica est cerrada y que no puede esperar hasta el siguiente da  hbil, puede llamar/localizar a su doctor(a) al nmero que aparece a continuacin.   Por favor, tenga en cuenta que aunque hacemos todo lo posible para estar disponibles para asuntos urgentes fuera del horario de [place], no estamos disponibles las 24 horas del da, los 7 das de la [place].   Si tiene un problema urgente y no puede comunicarse con nosotros, puede optar por buscar atencin mdica  en el consultorio de su doctor(a), en una clnica privada, en un centro de atencin urgente o en una sala de emergencias.  Si tiene Engineering geologist, por favor llame inmediatamente al 911 o vaya a la sala de emergencias.  Nmeros de bper  - Dr. Nehemiah Massed: [phone]  - Dra. Moye: [phone]  - Dra. Nicole Kindred: [phone]  En caso de inclemencias del [place], por favor llame a Johnsie Kindred principal al [phone] para una actualizacin sobre el [place] de cualquier retraso o cierre.  Consejos para  la medicacin en dermatologa: Por favor, guarde las cajas en las que vienen los medicamentos de uso tpico para ayudarle a seguir las instrucciones sobre dnde y cmo usarlos. Las farmacias generalmente imprimen las instrucciones del medicamento slo en las cajas y no directamente en los tubos del [place].   Si su medicamento es muy caro, por favor, pngase en contacto con Zigmund Daniel llamando al [phone] y presione la opcin 4 o envenos un mensaje a travs de Pharmacist, community.   No podemos decirle cul ser su copago por los medicamentos por adelantado ya que esto es diferente dependiendo de la cobertura de su seguro. Sin embargo, es posible que podamos encontrar un medicamento sustituto a Electrical engineer un formulario para que el seguro cubra el medicamento que se considera necesario.   Si se requiere una autorizacin previa para que su compaa de seguros [place] su medicamento, por favor permtanos de 1 a 2 das hbiles para completar este proceso.  Los precios de los medicamentos  varan con frecuencia dependiendo del Environmental consultant de dnde se surte la receta y alguna farmacias pueden ofrecer precios ms baratos.  El sitio web www.goodrx.com tiene cupones para medicamentos de Airline pilot. Los precios aqu no tienen en cuenta lo que podra costar con la ayuda del seguro (puede ser ms barato con su seguro), pero el sitio web puede darle el precio si no utiliz Research scientist (physical sciences).  - Puede imprimir el cupn correspondiente y llevarlo con su receta a la farmacia.  - Tambin puede pasar por nuestra oficina durante el horario de atencin regular y Charity fundraiser una tarjeta de cupones de GoodRx.  - Si necesita que su receta se enve electrnicamente a una farmacia diferente, informe a nuestra oficina a travs de MyChart de [place] o por telfono llamando al [phone] y presione la opcin 4.

## 2022-05-03 ENCOUNTER — Encounter: Payer: Self-pay | Admitting: Dermatology

## 2022-05-06 ENCOUNTER — Telehealth: Payer: Self-pay

## 2022-05-06 NOTE — Telephone Encounter (Signed)
-----   Message from Alfonso Patten, MD sent at 05/03/2022 11:55 AM EDT ----- 1. Skin , right pretibia superior SQUAMOUS CELL CARCINOMA, KERATOACANTHOMA TYPE, FEATURES OF REGRESSION 2. 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 Skin , right ankle SQUAMOUS CELL CARCINOMA, KERATOACANTHOMA TYPE  All have already been treated with ED&C. Monitor for anything growing back. Recheck at her follow-up appointment.   MAs please call. Thank you!

## 2022-05-06 NOTE — Telephone Encounter (Signed)
Patient advised of biopsy results. aw

## 2022-09-09 ENCOUNTER — Ambulatory Visit: Payer: Medicare Other | Admitting: Dermatology

## 2022-09-11 ENCOUNTER — Encounter: Payer: Self-pay | Admitting: Dermatology

## 2022-09-11 ENCOUNTER — Ambulatory Visit (INDEPENDENT_AMBULATORY_CARE_PROVIDER_SITE_OTHER): Payer: Medicare Other | Admitting: Dermatology

## 2022-09-11 VITALS — BP 155/83

## 2022-09-11 DIAGNOSIS — C44729 Squamous cell carcinoma of skin of left lower limb, including hip: Secondary | ICD-10-CM | POA: Diagnosis not present

## 2022-09-11 DIAGNOSIS — L720 Epidermal cyst: Secondary | ICD-10-CM | POA: Diagnosis not present

## 2022-09-11 DIAGNOSIS — Z79899 Other long term (current) drug therapy: Secondary | ICD-10-CM

## 2022-09-11 DIAGNOSIS — L738 Other specified follicular disorders: Secondary | ICD-10-CM | POA: Diagnosis not present

## 2022-09-11 DIAGNOSIS — L409 Psoriasis, unspecified: Secondary | ICD-10-CM | POA: Diagnosis not present

## 2022-09-11 DIAGNOSIS — D492 Neoplasm of unspecified behavior of bone, soft tissue, and skin: Secondary | ICD-10-CM

## 2022-09-11 DIAGNOSIS — Z91018 Allergy to other foods: Secondary | ICD-10-CM

## 2022-09-11 MED ORDER — BETAMETHASONE DIPROPIONATE 0.05 % EX OINT: TOPICAL_OINTMENT | Freq: Two times a day (BID) | CUTANEOUS | 2 refills | Status: AC | PRN

## 2022-09-11 NOTE — Progress Notes (Signed)
Follow-Up Visit   Subjective  Lori Dalton is a 82 y.o. female who presents for the following: FBSE (Hx Melanoma in situ, SCC, SCCis. Patient with a spot at left thigh and a scaly spot left ear. ), Psoriasis (Currently using mometasone and Zoryve.), and Rosacea (Currently using metronidazole 0.75% ).  The patient presents for Total-Body Skin Exam (TBSE) for skin cancer screening and mole check.  The patient has spots, moles and lesions to be evaluated, some may be new or changing and the patient has concerns that these could be cancer.   The following portions of the chart were reviewed this encounter and updated as appropriate:   Allergies  Meds  Problems  Med Hx  Surg Hx  Fam Hx      Review of Systems:  No other skin or systemic complaints except as noted in HPI or Assessment and Plan.  Objective  Well appearing patient in no apparent distress; mood and affect are within normal limits.  A full examination was performed including scalp, head, eyes, ears, nose, lips, neck, chest, axillae, abdomen, back, buttocks, bilateral upper extremities, bilateral lower extremities, hands, feet, fingers, toes, fingernails, and toenails. All findings within normal limits unless otherwise noted below.  Scalp Scattered scaly pink papules and plaques, particularly widespread at lower extremities  left helix 0.6 cm pink and white papule      Left Anterior Thigh 0.7 cm firm pink papule       right jaw 0.5 cm pink papule R/o BCC    Assessment & Plan  Psoriasis Scalp  Chronic and persistent condition with duration or expected duration over one year. Condition is bothersome/symptomatic for patient. Currently flared.  15% BSA  Discussed options of systemic therapy.  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  She briefly tried [place] in the past and notes some worsening of rash around that time but did not  try for a long.  Discussed options of Biologics and Sotyktu but she would prefer to retry Otezla if it does not have any bovine components.  Will confirm with medical science liaison and give her an update.  If no bovine components will give her sample of Otezla to try.  Counseling on psoriasis and coordination of care  psoriasis is a chronic non-curable, but treatable genetic/hereditary disease that may have other systemic features affecting other organ systems such as joints (Psoriatic Arthritis). It is associated with an increased risk of inflammatory bowel disease, heart disease, non-alcoholic fatty liver disease, and depression.  Treatments include light and laser treatments; topical medications; and systemic medications including oral and injectables.  No joint pain  Discontinue mometasone and start betamethasone 0.05% ointment twice daily x 2 weeks then weekends only. Avoid applying to face, groin, and axilla. Use as directed. Long-term use can cause thinning of the skin.  Topical steroids (such as triamcinolone, fluocinolone, fluocinonide, mometasone, clobetasol, halobetasol, betamethasone, hydrocortisone) can cause thinning and lightening of the skin if they are used for too long in the same area. Your physician has selected the right strength medicine for your problem and area affected on the body. Please use your medication only as directed by your physician to prevent side effects.   Side effects of Otezla (apremilast) include diarrhea, nausea, headache, upper respiratory infection, depression, and weight decrease (5-10%). It should only be taken by pregnant women after a discussion regarding risks and benefits with their doctor. Goal is control of  skin condition, not cure.  The use of [name] requires long term medication management, including periodic office visits.   betamethasone dipropionate (DIPROLENE) 0.05 % ointment - Scalp Apply topically 2 (two) times daily as needed (Rash). Apply  twice daily to affected areas for 2 weeks then weekends only. Avoid applying to face, groin, and axilla. Use as directed. Long-term use can cause thinning of the skin.  Related Medications Roflumilast (ZORYVE) 0.3 % CREA Apply 1 application topically daily. Qd to aa psoriasis  mometasone (ELOCON) 0.1 % lotion Apply topically daily. Use up to 5 days a week  Neoplasm of skin (3) left helix  Skin / nail biopsy Type of biopsy: tangential   Informed consent: discussed and consent obtained   Timeout: patient name, date of birth, surgical site, and procedure verified   Procedure prep:  Patient was prepped and draped in usual sterile fashion Prep type:  Isopropyl alcohol Anesthesia: the lesion was anesthetized in a standard fashion   Anesthetic:  1% lidocaine w/ epinephrine 1-100,000 buffered w/ 8.4% NaHCO3 Instrument used: flexible razor blade   Hemostasis achieved with: aluminum chloride   Outcome: patient tolerated procedure well   Post-procedure details: wound care instructions given   Additional details:  Petrolatum and a pressure bandage applied  Left Anterior Thigh  Epidermal / dermal shaving  Lesion diameter (cm):  0.7 Informed consent: discussed and consent obtained   Timeout: patient name, date of birth, surgical site, and procedure verified   Anesthesia: the lesion was anesthetized in a standard fashion   Anesthetic:  1% lidocaine w/ epinephrine 1-100,000 local infiltration Instrument used: flexible razor blade   Hemostasis achieved with: aluminum chloride   Outcome: patient tolerated procedure well   Post-procedure details: wound care instructions given   Additional details:  Mupirocin and a bandage applied  Destruction of lesion  Destruction method: electrodesiccation and curettage   Informed consent: discussed and consent obtained   Timeout:  patient name, date of birth, surgical site, and procedure verified Anesthesia: the lesion was anesthetized in a standard  fashion   Anesthetic:  1% lidocaine w/ epinephrine 1-100,000 buffered w/ 8.4% NaHCO3 Curettage performed in three different directions: Yes   Electrodesiccation performed over the curetted area: Yes   Curettage cycles:  3 Final wound size (cm):  0.9 Hemostasis achieved with:  electrodesiccation Outcome: patient tolerated procedure well with no complications   Post-procedure details: sterile dressing applied and wound care instructions given   Dressing type: petrolatum    right jaw  Skin / nail biopsy Type of biopsy: tangential   Informed consent: discussed and consent obtained   Timeout: patient name, date of birth, surgical site, and procedure verified   Procedure prep:  Patient was prepped and draped in usual sterile fashion Prep type:  Isopropyl alcohol Anesthesia: the lesion was anesthetized in a standard fashion   Anesthetic:  1% lidocaine w/ epinephrine 1-100,000 buffered w/ 8.4% NaHCO3 Instrument used: flexible razor blade   Hemostasis achieved with: aluminum chloride   Outcome: patient tolerated procedure well   Post-procedure details: wound care instructions given   Additional details:  Petrolatum and a pressure bandage applied  Specimen 3 - Surgical pathology Differential Diagnosis: R/o BCC  Check Margins: No 0.5 cm pink papule   Allergy to alpha-gal   History of Melanoma in Situ - No evidence of recurrence today - Recommend regular full body skin exams - Recommend daily broad spectrum sunscreen SPF 30+ to sun-exposed areas, reapply every 2 hours as needed.  - Call  if any new or changing lesions are noted between office visits  History of Squamous Cell Carcinoma of the Skin - No evidence of recurrence today - No lymphadenopathy - Recommend regular full body skin exams - Recommend daily broad spectrum sunscreen SPF 30+ to sun-exposed areas, reapply every 2 hours as needed.  - Call if any new or changing lesions are noted between office visits  History of  Squamous Cell Carcinoma in Situ of the Skin - No evidence of recurrence today - Recommend regular full body skin exams - Recommend daily broad spectrum sunscreen SPF 30+ to sun-exposed areas, reapply every 2 hours as needed.  - Call if any new or changing lesions are noted between office visits  Actinic keratoses are precancerous spots that appear secondary to cumulative UV radiation exposure/sun exposure over time. They are chronic with expected duration over 1 year. A portion of actinic keratoses will progress to squamous cell carcinoma of the skin. It is not possible to reliably predict which spots will progress to skin cancer and so treatment is recommended to prevent development of skin cancer.  Recommend daily broad spectrum sunscreen SPF 30+ to sun-exposed areas, reapply every 2 hours as needed.  Recommend staying in the shade or wearing long sleeves, sun glasses (UVA+UVB protection) and wide brim hats (4-inch brim around the entire circumference of the hat). Call for new or changing lesions.   Prior to procedure, discussed risks of blister formation, small wound, skin dyspigmentation, or rare scar following cryotherapy. Recommend Vaseline ointment to treated areas while healing.  Procedure Note: Cryotherapy Destruction method: cryotherapy   Informed consent: discussed and consent obtained   Timeout:  patient name, date of birth, surgical site, and procedure verified Lesion destroyed using liquid nitrogen: Yes   Region frozen until ice ball extended beyond lesion: Yes   Outcome: patient tolerated procedure well with no complications   Post-procedure details: wound care instructions given   Locations Treated: left cheek x 1, left earlobe x 1, right anterior thigh x 1, left ear x 1, left cheek x 1 # Treated: 5   Return in about 6 months (around 03/12/2023) for TBSE, Psoriasis.  Graciella Belton, RMA, am acting as scribe for Forest Gleason, MD .   Documentation: I have reviewed the  above documentation for accuracy and completeness, and I agree with the above.  Forest Gleason, MD

## 2022-09-11 NOTE — Patient Instructions (Addendum)
Discontinue mometasone and start betamethasone 0.05% ointment twice daily x 2 weeks then weekends only. Avoid applying to face, groin, and axilla. Use as directed. Long-term use can cause thinning of the skin.  Topical steroids (such as triamcinolone, fluocinolone, fluocinonide, mometasone, clobetasol, halobetasol, betamethasone, hydrocortisone) can cause thinning and lightening of the skin if they are used for too long in the same area. Your physician has selected the right strength medicine for your problem and area affected on the body. Please use your medication only as directed by your physician to prevent side effects.    Recommend Niacinamide or Nicotinamide 581m twice per day to lower risk of non-melanoma skin cancer by approximately 25%. This is usually available at [employer].    Wound Care Instructions  Cleanse wound gently with soap and water once a day then pat dry with clean gauze. Apply a thin coat of Petrolatum (petroleum jelly, "Vaseline") over the wound (unless you have an allergy to this). We recommend that you use a new, sterile tube of Vaseline. Do not pick or remove scabs. Do not remove the yellow or white "healing tissue" from the base of the wound.  Cover the wound with fresh, clean, nonstick gauze and secure with paper tape. You may use Band-Aids in place of gauze and tape if the wound is small enough, but would recommend trimming much of the tape off as there is often too much. Sometimes Band-Aids can irritate the skin.  You should call the office for your biopsy report after 1 week if you have not already been contacted.  If you experience any problems, such as abnormal amounts of bleeding, swelling, significant bruising, significant pain, or evidence of infection, please call the office immediately.  FOR ADULT SURGERY PATIENTS: If you need something for pain relief you may take 1 extra strength Tylenol (acetaminophen) AND 2 Ibuprofen (2063meach) together every 4  hours as needed for pain. (do not take these if you are allergic to them or if you have a reason you should not take them.) Typically, you may only need pain medication for 1 to 3 days.   Cryotherapy Aftercare  Wash gently with soap and water everyday.   Apply Vaseline and Band-Aid daily until healed.   Recommend taking Heliocare sun protection supplement daily in sunny weather for additional sun protection. For maximum protection on the sunniest days, you can take up to 2 capsules of regular Heliocare OR take 1 capsule of Heliocare Ultra. For prolonged exposure (such as a full day in the sun), you can repeat your dose of the supplement 4 hours after your first dose. Heliocare can be purchased at [employer] some [facility] or at [email or web address]   Melanoma ABCDEs  Melanoma is the most dangerous type of skin cancer, and is the leading cause of death from skin disease.  You are more likely to develop melanoma if you: Have light-colored skin, light-colored eyes, or red or blond hair Spend a lot of time in the sun Tan regularly, either outdoors or in a tanning bed Have had blistering sunburns, especially during childhood Have a close family member who has had a melanoma Have atypical moles or large birthmarks  Early detection of melanoma is key since treatment is typically straightforward and cure rates are extremely high if we catch it early.   The first sign of melanoma is often a change in a mole or a new dark spot.  The ABCDE system is a way of remembering the signs  of melanoma.  A for asymmetry:  The two halves do not match. B for border:  The edges of the growth are irregular. C for color:  A mixture of colors are present instead of an even brown color. D for diameter:  Melanomas are usually (but not always) greater than 69m - the size of a pencil eraser. E for evolution:  The spot keeps changing in size, shape, and color.  Please check your skin once per month between  visits. You can use a small mirror in front and a large mirror behind you to keep an eye on the back side or your body.   If you see any new or changing lesions before your next follow-up, please call to schedule a visit.  Please continue daily skin protection including broad spectrum sunscreen SPF 30+ to sun-exposed areas, reapplying every 2 hours as needed when you're outdoors.    Due to recent changes in healthcare laws, you may see results of your pathology and/or laboratory studies on MyChart before the doctors have had a chance to review them. We understand that in some cases there may be results that are confusing or concerning to you. Please understand that not all results are received at the same time and often the doctors may need to interpret multiple results in order to provide you with the best plan of care or course of treatment. Therefore, we ask that you please give u[place]2 business days to thoroughly review all your results before contacting the office for clarification. Should we see a critical lab result, you will be contacted sooner.   If You Need Anything After Your Visit  If you have any questions or concerns for your doctor, please call our main line at [phone] press option 4 to reach your doctor's medical assistant. If no one answers, please leave a voicemail as directed and we will return your call as soon as possible. Messages left after 4 pm will be answered the following business day.   You may also send [place] message via [place] We typically respond to MyChart messages within 1-2 business days.  For prescription refills, please ask your pharmacy to contact our office. Our fax number is [phone]  If you have an urgent issue when the clinic is closed that cannot wait until the next business day, you can page your doctor at the number below.    Please note that while we do our best to be available for urgent issues outside of office hours, we are not available  24/7.   If you have an urgent issue and are unable to reach u[place] you may choose to seek medical care at your doctor's office, retail clinic, urgent care center, or emergency room.  If you have a medical emergency, please immediately call 911 or go to the emergency department.  Pager Numbers  - Dr. KNehemiah Massed [phone] - Dr. MLaurence Ferrari [phone] - Dr. SNicole Kindred [phone] In the event of inclement weather, please call our main line at [phone] an update on the status of any delays or closures.  Dermatology Medication Tips: Please keep the boxes that topical medications come in in order to help keep track of the instructions about where and how to use these. Pharmacies typically print the medication instructions only on the boxes and not directly on the medication tubes.   If your medication is too expensive, please contact our office at [phone] 4 or send u[place] message through [facility]  We are unable to tell what your co-pay for medications will be in advance as this is different depending on your insurance coverage. However, we may be able to find a substitute medication at lower cost or fill out paperwork to get insurance to cover a needed medication.   If a prior authorization is required to get your medication covered by your insurance company, please allow [place] 1-2 business days to complete this process.  Drug prices often vary depending on where the prescription is filled and some pharmacies may offer cheaper prices.  The website www.goodrx.com contains coupons for medications through different pharmacies. The prices here do not account for what the cost may be with help from insurance (it may be cheaper with your insurance), but the website can give you the price if you did not use any insurance.  - You can print the associated coupon and take it with your prescription to the pharmacy.  - You may also stop by our office during regular business hours and pick up  a GoodRx coupon card.  - If you need your prescription sent electronically to a different pharmacy, notify our office through [facility] or by phone at [phone] option 4.     Si Usted Necesita Algo Despus de Su Visita  Tambin puede enviarnos un mensaje a travs de Pharmacist, community. Por lo general respondemos a los mensajes de MyChart en el transcurso de 1 a 2 das hbiles.  Para renovar recetas, por favor pida a su farmacia que se ponga en contacto con nuestra oficina. Harland Dingwall de fax es [place] [phone].  Si tiene un asunto urgente cuando la clnica est cerrada y que no puede esperar hasta el siguiente da hbil, puede llamar/localizar a su doctor(a) al nmero que aparece a continuacin.   Por favor, tenga en cuenta que aunque hacemos todo lo posible para estar disponibles para asuntos urgentes fuera del horario de [place], no estamos disponibles las 24 horas del da, los 7 das de la [place].   Si tiene un problema urgente y no puede comunicarse con nosotros, puede optar por buscar atencin mdica  en el consultorio de su doctor(a), en una clnica privada, en un centro de atencin urgente o en una sala de emergencias.  Si tiene Engineering geologist, por favor llame inmediatamente al 911 o vaya a la sala de emergencias.  Nmeros de bper  - Dr. Nehemiah Massed: [phone]  - Dra. Moye: [phone]  - Dra. Nicole Kindred: [phone]  En caso de inclemencias del [place], por favor llame a Johnsie Kindred principal al [phone] para una actualizacin sobre el [place] de cualquier retraso o cierre.  Consejos para la medicacin en dermatologa: Por favor, guarde las cajas en las que vienen los medicamentos de uso tpico para ayudarle a seguir las instrucciones sobre dnde y cmo usarlos. Las farmacias generalmente imprimen las instrucciones del medicamento slo en las cajas y no directamente en los tubos del [place].   Si su medicamento es muy caro, por favor, pngase en contacto  con Zigmund Daniel llamando al [phone] y presione la opcin 4 o envenos un mensaje a travs de Pharmacist, community.   No podemos decirle cul ser su copago por los medicamentos por adelantado ya que esto es diferente dependiendo de la cobertura de su seguro. Sin embargo, es posible que podamos encontrar un medicamento sustituto a Electrical engineer un formulario para que el seguro cubra el medicamento que se considera necesario.   Si se requiere una autorizacin previa para que su  compaa de seguros [place] su medicamento, por favor permtanos de 1 a 2 das hbiles para completar este proceso.  Los precios de los medicamentos varan con frecuencia dependiendo del Environmental consultant de dnde se surte la receta y alguna farmacias pueden ofrecer precios ms baratos.  El sitio web www.goodrx.com tiene cupones para medicamentos de Airline pilot. Los precios aqu no tienen en cuenta lo que podra costar con la ayuda del seguro (puede ser ms barato con su seguro), pero el sitio web puede darle el precio si no utiliz Research scientist (physical sciences).  - Puede imprimir el cupn correspondiente y llevarlo con su receta a la farmacia.  - Tambin puede pasar por nuestra oficina durante el horario de atencin regular y Charity fundraiser una tarjeta de cupones de GoodRx.  - Si necesita que su receta se enve electrnicamente a una farmacia diferente, informe a nuestra oficina a travs de MyChart de [place] o por telfono llamando al [phone] y presione la opcin 4.

## 2022-09-13 ENCOUNTER — Encounter: Payer: Self-pay | Admitting: Dermatology

## 2022-09-13 DIAGNOSIS — Z91018 Allergy to other foods: Secondary | ICD-10-CM | POA: Insufficient documentation

## 2022-09-14 ENCOUNTER — Encounter: Payer: Self-pay | Admitting: Dermatology

## 2022-09-19 ENCOUNTER — Telehealth: Payer: Self-pay

## 2022-09-19 NOTE — Telephone Encounter (Signed)
LMOVM for patient to C/B for pathology results and Tx information.

## 2022-09-19 NOTE — Telephone Encounter (Signed)
ERROR

## 2022-09-19 NOTE — Telephone Encounter (Signed)
-----   Message from Alfonso Patten, MD sent at 09/19/2022  2:32 PM EST ----- 1. Skin , left helix CONSISTENT WITH SURFACE OF AN EPIDERMOID CYST --> benign cyst, no cancer, no treatment needed  2. 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 Skin , right jaw SUPPURATIVE FOLLICULITIS AND SOLAR LENTIGO --> freckle and inflammation around the hair follicle, no treatment needed  MAs please call with results and schedule. Thank you!

## 2022-09-23 ENCOUNTER — Telehealth: Payer: Self-pay

## 2022-09-23 NOTE — Telephone Encounter (Signed)
-----   Message from Alfonso Patten, MD sent at 09/20/2022  4:39 PM EST ----- Correction: We already treated her thigh with ED&C. No additional treatment needed. Thank you!

## 2022-09-23 NOTE — Telephone Encounter (Signed)
Patient advised of BX results .aw 

## 2022-09-24 ENCOUNTER — Telehealth: Payer: Self-pay | Admitting: Dermatology

## 2022-09-24 NOTE — Telephone Encounter (Signed)
Please advise Ms. 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 I will reach out to the [facility] (biologic injection) team to see if they use any lactose or other bovine products in the manufacturing process. Thank you!

## 2022-09-26 NOTE — Telephone Encounter (Signed)
Patient advised [name] is made with lactose and could be triggering her Alpha Gal - Patient will not take. Lurlean Horns., RMA

## 2022-11-26 ENCOUNTER — Other Ambulatory Visit: Payer: Self-pay | Admitting: Family Medicine

## 2022-11-26 DIAGNOSIS — E7800 Pure hypercholesterolemia, unspecified: Secondary | ICD-10-CM | POA: Insufficient documentation

## 2022-11-26 DIAGNOSIS — L409 Psoriasis, unspecified: Secondary | ICD-10-CM | POA: Insufficient documentation

## 2022-11-26 DIAGNOSIS — R011 Cardiac murmur, unspecified: Secondary | ICD-10-CM | POA: Insufficient documentation

## 2022-11-26 DIAGNOSIS — Z85820 Personal history of malignant melanoma of skin: Secondary | ICD-10-CM | POA: Insufficient documentation

## 2022-11-26 DIAGNOSIS — Z1231 Encounter for screening mammogram for malignant neoplasm of breast: Secondary | ICD-10-CM

## 2022-12-23 ENCOUNTER — Ambulatory Visit
Admission: RE | Admit: 2022-12-23 | Discharge: 2022-12-23 | Disposition: A | Discharge status: Home / Self Care | Payer: Medicare Other | Source: Ambulatory Visit | Attending: Family Medicine | Admitting: Family Medicine

## 2022-12-23 DIAGNOSIS — Z1231 Encounter for screening mammogram for malignant neoplasm of breast: Secondary | ICD-10-CM | POA: Diagnosis present

## 2023-01-06 ENCOUNTER — Telehealth: Payer: Self-pay | Admitting: Dermatology

## 2023-01-06 DIAGNOSIS — Z7960 Long term (current) use of unspecified immunomodulators and immunosuppressants: Secondary | ICD-10-CM

## 2023-01-06 DIAGNOSIS — L400 Psoriasis vulgaris: Secondary | ICD-10-CM

## 2023-01-06 NOTE — Telephone Encounter (Signed)
Spoke with patient. 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 I reviewed with the MSL for Skyrizi the ingredients list and none are known to be bovine derived or an issue for patients with alpha gal syndrome.   Discussed with Lori Dalton and she would like to pursue Skyrizi treatment if she can get approved. Will order quantiferon gold and if negative, we can submit for [employer].

## 2023-01-06 NOTE — Telephone Encounter (Signed)
Can you please print the lab slip for the quantiferon gold and put it at the front desk for Ms. Lori Dalton and let her know it is there? She is at the [facility] right up the street and could stop by to get it in about an hour. Thank you!

## 2023-01-06 NOTE — Telephone Encounter (Signed)
Lab req left at front desk. aw

## 2023-01-13 LAB — QUANTIFERON-TB GOLD PLUS
QuantiFERON Mitogen Value: 6.24 IU/mL
QuantiFERON Nil Value: 0.06 IU/mL
QuantiFERON TB1 Ag Value: 0.03 IU/mL
QuantiFERON TB2 Ag Value: 0.03 IU/mL
QuantiFERON-TB Gold Plus: NEGATIVE

## 2023-02-05 ENCOUNTER — Telehealth: Payer: Self-pay

## 2023-02-05 MED ORDER — SKYRIZI PEN 150 MG/ML ~~LOC~~ SOAJ: 150.0000 mg | SUBCUTANEOUS | 1 refills | Status: DC

## 2023-02-05 NOTE — Telephone Encounter (Signed)
Patient came in the office today regarding TB test results.  Patient advised negative. We went over [place] paperwork and patient will bring that back into the office.  RX sent to [facility].

## 2023-03-13 ENCOUNTER — Ambulatory Visit (INDEPENDENT_AMBULATORY_CARE_PROVIDER_SITE_OTHER): Payer: Medicare Other | Admitting: Dermatology

## 2023-03-13 ENCOUNTER — Encounter: Payer: Self-pay | Admitting: Dermatology

## 2023-03-13 ENCOUNTER — Encounter: Payer: Medicare Other | Admitting: Dermatology

## 2023-03-13 VITALS — BP 169/78 | HR 69

## 2023-03-13 DIAGNOSIS — L400 Psoriasis vulgaris: Secondary | ICD-10-CM

## 2023-03-13 DIAGNOSIS — L578 Other skin changes due to chronic exposure to nonionizing radiation: Secondary | ICD-10-CM | POA: Diagnosis not present

## 2023-03-13 DIAGNOSIS — Z1283 Encounter for screening for malignant neoplasm of skin: Secondary | ICD-10-CM | POA: Diagnosis not present

## 2023-03-13 DIAGNOSIS — L814 Other melanin hyperpigmentation: Secondary | ICD-10-CM

## 2023-03-13 DIAGNOSIS — L570 Actinic keratosis: Secondary | ICD-10-CM

## 2023-03-13 DIAGNOSIS — W908XXA Exposure to other nonionizing radiation, initial encounter: Secondary | ICD-10-CM | POA: Diagnosis not present

## 2023-03-13 DIAGNOSIS — D692 Other nonthrombocytopenic purpura: Secondary | ICD-10-CM

## 2023-03-13 DIAGNOSIS — L821 Other seborrheic keratosis: Secondary | ICD-10-CM

## 2023-03-13 DIAGNOSIS — D1801 Hemangioma of skin and subcutaneous tissue: Secondary | ICD-10-CM

## 2023-03-13 DIAGNOSIS — D229 Melanocytic nevi, unspecified: Secondary | ICD-10-CM

## 2023-03-13 DIAGNOSIS — Z86007 Personal history of in-situ neoplasm of skin: Secondary | ICD-10-CM

## 2023-03-13 DIAGNOSIS — Z86006 Personal history of melanoma in-situ: Secondary | ICD-10-CM

## 2023-03-13 DIAGNOSIS — Z85828 Personal history of other malignant neoplasm of skin: Secondary | ICD-10-CM

## 2023-03-13 DIAGNOSIS — L409 Psoriasis, unspecified: Secondary | ICD-10-CM

## 2023-03-13 MED ORDER — RISANKIZUMAB-RZAA 150 MG/ML ~~LOC~~ SOAJ
150.0000 mg | Freq: Once | SUBCUTANEOUS | Status: AC
Start: 2023-03-13 — End: 2023-03-13
  Administered 2023-03-13: 150 mg via SUBCUTANEOUS

## 2023-03-13 NOTE — Progress Notes (Signed)
Follow-Up Visit   Subjective  Lori Dalton is a 82 y.o. female who presents for the following: Skin Cancer Screening and Full Body Skin Exam. HxMIS, HxSCC, HxAKs  The patient presents for Total-Body Skin Exam (TBSE) for skin cancer screening and mole check. The patient has spots, moles and lesions to be evaluated, some may be new or changing and the patient may have concern these could be cancer.  Psoriasis. Scalp. Otezla made psoriasis worse. Currently waiting to see how much Skyrizi copay will be. Using Betamethasone ointment as directed. Approved for free Skyrizi until end of next year. +alpha-gal allergy  The following portions of the chart were reviewed this encounter and updated as appropriate: medications, allergies, medical history  Review of Systems:  No other skin or systemic complaints except as noted in HPI or Assessment and Plan.  Objective  Well appearing patient in no apparent distress; mood and affect are within normal limits.  A full examination was performed including scalp, head, eyes, ears, nose, lips, neck, chest, axillae, abdomen, back, buttocks, bilateral upper extremities, bilateral lower extremities, hands, feet, fingers, toes, fingernails, and toenails. All findings within normal limits unless otherwise noted below.   Relevant physical exam findings are noted in the Assessment and Plan.  Right Postauricular Area x1, R cheek x1 Erythematous thin papules/macules with gritty scale.     Assessment & Plan   SKIN CANCER SCREENING PERFORMED TODAY.  ACTINIC DAMAGE - Chronic condition, secondary to cumulative UV/sun exposure - diffuse scaly erythematous macules with underlying dyspigmentation - Recommend daily broad spectrum sunscreen SPF 30+ to sun-exposed areas, reapply every 2 hours as needed.  - Staying in the shade or wearing long sleeves, sun glasses (UVA+UVB protection) and wide brim hats (4-inch brim around the entire circumference of the hat) are also  recommended for sun protection.  - Call for new or changing lesions.  LENTIGINES, SEBORRHEIC KERATOSES, HEMANGIOMAS - Benign normal skin lesions - Benign-appearing - Call for any changes  MELANOCYTIC NEVI - Tan-brown and/or pink-flesh-colored symmetric macules and papules - Benign appearing on exam today - Observation - Call clinic for new or changing moles - Recommend daily use of broad spectrum spf 30+ sunscreen to sun-exposed areas.   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 Excised: 09/20/2014, margins free  - No evidence of recurrence today - No lymphadenopathy  - Recommend regular full body skin exams - Recommend daily broad spectrum sunscreen SPF 30+ to sun-exposed areas, reapply every 2 hours as needed.  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 - No evidence of recurrence today - No lymphadenopathy - Recommend regular full body skin exams - Recommend daily broad spectrum sunscreen SPF 30+ to sun-exposed areas, reapply every 2 hours as needed.  - Call if any new or changing lesions are noted between office visits   History of Squamous Cell Carcinoma in Situ of the Skin - No evidence of recurrence today - Recommend regular full body skin exams - Recommend daily broad spectrum sunscreen SPF 30+ to sun-exposed areas, reapply every 2 hours as needed.  - Call if any new or changing lesions are noted between office visits  Purpura - Chronic; persistent and recurrent.  Treatable, but not curable. - Violaceous macules and patches - Benign - Related to trauma, age, sun damage and/or use of blood thinners, chronic use of topical and/or  oral steroids - Observe - Can use OTC arnica containing moisturizer such as Dermend Bruise Formula if desired - Call for worsening or other concerns   PSORIASIS, flaring Exam: Well-demarcated  erythematous papules/plaques with silvery scale, guttate pink scaly papules at legs lower back, elbows, knees. Mild erythema in scalp. Postauricular sulci clear. 15% BSA.  Chronic and persistent condition with duration or expected duration over one year. Condition is symptomatic bothersome to patient. Not currently at goal.   patient denies joint pain  Psoriasis is a chronic non-curable, but treatable genetic/hereditary disease that may have other systemic features affecting other organ systems such as joints (Psoriatic Arthritis). It is associated with an increased risk of inflammatory bowel disease, heart disease, non-alcoholic fatty liver disease, and depression.  Treatments include light and laser treatments; topical medications; and systemic medications including oral and injectables.  Treatment Plan: Start Skyriz 150 mg day 0, repeat in 4 weeks to complete loading dose, then once every 3 months.   Skyrizi 150 mg injected into  NDC: 0074-2100-70 Lot: [id number] Exp: 04/2024  Patient approved for free Skyrizi from company until end of 2025  AK (actinic keratosis) Right Postauricular Area x1, R cheek x1  Actinic keratoses are precancerous spots that appear secondary to cumulative UV radiation exposure/sun exposure over time. They are chronic with expected duration over 1 year. A portion of actinic keratoses will progress to squamous cell carcinoma of the skin. It is not possible to reliably predict which spots will progress to skin cancer and so treatment is recommended to prevent development of skin cancer.  Recommend daily broad spectrum sunscreen SPF 30+ to sun-exposed areas, reapply every 2 hours as needed.  Recommend staying in the shade or wearing long sleeves, sun glasses (UVA+UVB protection) and wide brim hats (4-inch brim around the entire circumference of the hat). Call for new or changing lesions.  Destruction of lesion - Right Postauricular Area x1, R cheek x1  Destruction  method: cryotherapy   Informed consent: discussed and consent obtained   Lesion destroyed using liquid nitrogen: Yes   Region frozen until ice ball extended beyond lesion: Yes   Outcome: patient tolerated procedure well with no complications   Post-procedure details: wound care instructions given   Additional details:  Prior to procedure, discussed risks of blister formation, small wound, skin dyspigmentation, or rare scar following cryotherapy. Recommend Vaseline ointment to treated areas while healing.    Return in about 6 months (around 09/13/2023) for TBSE, HxMIS, HxSCC.  I, Lawson Radar, CMA, am acting as scribe for Elie Goody, MD.   Documentation: I have reviewed the above documentation for accuracy and completeness, and I agree with the above.  Elie Goody, MD

## 2023-03-13 NOTE — Patient Instructions (Addendum)
Start Skyrizi 150 mg every 12 weeks after loading dose today and loading dose in  4 weeks.   Reviewed risks of biologics including immunosuppression, infections, injection site reaction, and failure to improve condition. Goal is control of skin condition, not cure.  Some older biologics such as Humira and Enbrel may slightly increase risk of malignancy and may worsen congestive heart failure.  Taltz and Cosentyx may cause inflammatory bowel disease to flare. The use of biologics requires long term medication management, including periodic office visits and monitoring of blood work.  Cryotherapy Aftercare  Wash gently with soap and water everyday.   Apply Vaseline Jelly daily until healed.    Recommend Niacinamide or Nicotinamide 500mg  twice per day to lower risk of non-melanoma skin cancer by approximately 25%. This is usually available at [employer].   Recommend daily broad spectrum sunscreen SPF 30+ to sun-exposed areas, reapply every 2 hours as needed. Call for new or changing lesions.  Staying in the shade or wearing long sleeves, sun glasses (UVA+UVB protection) and wide brim hats (4-inch brim around the entire circumference of the hat) are also recommended for sun protection.    Melanoma ABCDEs  Melanoma is the most dangerous type of skin cancer, and is the leading cause of death from skin disease.  You are more likely to develop melanoma if you: Have light-colored skin, light-colored eyes, or red or blond hair Spend a lot of time in the sun Tan regularly, either outdoors or in a tanning bed Have had blistering sunburns, especially during childhood Have a close family member who has had a melanoma Have atypical moles or large birthmarks  Early detection of melanoma is key since treatment is typically straightforward and cure rates are extremely high if we catch it early.   The first sign of melanoma is often a change in a mole or a new dark spot.  The ABCDE system is a way of  remembering the signs of melanoma.  A for asymmetry:  The two halves do not match. B for border:  The edges of the growth are irregular. C for color:  A mixture of colors are present instead of an even brown color. D for diameter:  Melanomas are usually (but not always) greater than 6mm - the size of a pencil eraser. E for evolution:  The spot keeps changing in size, shape, and color.  Please check your skin once per month between visits. You can use a small mirror in front and a large mirror behind you to keep an eye on the back side or your body.   If you see any new or changing lesions before your next follow-up, please call to schedule a visit.  Please continue daily skin protection including broad spectrum sunscreen SPF 30+ to sun-exposed areas, reapplying every 2 hours as needed when you're outdoors.   Staying in the shade or wearing long sleeves, sun glasses (UVA+UVB protection) and wide brim hats (4-inch brim around the entire circumference of the hat) are also recommended for sun protection.    Due to recent changes in healthcare laws, you may see results of your pathology and/or laboratory studies on MyChart before the doctors have had a chance to review them. We understand that in some cases there may be results that are confusing or concerning to you. Please understand that not all results are received at the same time and often the doctors may need to interpret multiple results in order to provide you with the best plan  of care or course of treatment. Therefore, we ask that you please give [place] 2 business days to thoroughly review all your results before contacting the office for clarification. Should we see a critical lab result, you will be contacted sooner.   If You Need Anything After Your Visit  If you have any questions or concerns for your doctor, please call our main line at [phone] and press option 4 to reach your doctor's medical assistant. If no one answers, please  leave a voicemail as directed and we will return your call as soon as possible. Messages left after 4 pm will be answered the following business day.   You may also send [place] a message via MyChart. We typically respond to MyChart messages within 1-2 business days.  For prescription refills, please ask your pharmacy to contact our office. Our fax number is [phone].  If you have an urgent issue when the clinic is closed that cannot wait until the next business day, you can page your doctor at the number below.    Please note that while we do our best to be available for urgent issues outside of office hours, we are not available 24/7.   If you have an urgent issue and are unable to reach [place], you may choose to seek medical care at your doctor's office, retail clinic, urgent care center, or emergency room.  If you have a medical emergency, please immediately call 911 or go to the emergency department.  Pager Numbers  - Dr. Gwen Pounds: [phone]  - Dr. Roseanne Reno: [phone]  - Dr. Katrinka Blazing: [phone]   In the event of inclement weather, please call our main line at [phone] for an update on the status of any delays or closures.  Dermatology Medication Tips: Please keep the boxes that topical medications come in in order to help keep track of the instructions about where and how to use these. Pharmacies typically print the medication instructions only on the boxes and not directly on the medication tubes.   If your medication is too expensive, please contact our office at [phone] option 4 or send [place] a message through MyChart.   We are unable to tell what your co-pay for medications will be in advance as this is different depending on your insurance coverage. However, we may be able to find a substitute medication at lower cost or fill out paperwork to get insurance to cover a needed medication.   If a prior authorization is required to get your medication covered by your  insurance company, please allow [place] 1-2 business days to complete this process.  Drug prices often vary depending on where the prescription is filled and some pharmacies may offer cheaper prices.  The website www.goodrx.com contains coupons for medications through different pharmacies. The prices here do not account for what the cost may be with help from insurance (it may be cheaper with your insurance), but the website can give you the price if you did not use any insurance.  - You can print the associated coupon and take it with your prescription to the pharmacy.  - You may also stop by our office during regular business hours and pick up a GoodRx coupon card.  - If you need your prescription sent electronically to a different pharmacy, notify our office through [facility] or by phone at [phone] option 4.     Si Usted Necesita Algo Despus de Su Visita  Tambin puede enviarnos un mensaje a travs de Clinical cytogeneticist. Por  lo general respondemos a los mensajes de MyChart en el transcurso de 1 a 2 das hbiles.  Para renovar recetas, por favor pida a su farmacia que se ponga en contacto con nuestra oficina. Annie Sable de fax es [place] [phone].  Si tiene un asunto urgente cuando la clnica est cerrada y que no puede esperar hasta el siguiente da hbil, puede llamar/localizar a su doctor(a) al nmero que aparece a continuacin.   Por favor, tenga en cuenta que aunque hacemos todo lo posible para estar disponibles para asuntos urgentes fuera del horario de [place], no estamos disponibles las 24 horas del da, los 7 [address]  [address] de la [place].   Si tiene un problema urgente y no puede comunicarse con nosotros, puede optar por buscar atencin mdica  en el consultorio de su doctor(a), en una clnica privada, en un centro de atencin urgente o en una sala de emergencias.  Si tiene Engineer, drilling, por favor llame inmediatamente al 911 o vaya a la sala de emergencias.  Nmeros de  bper  - Dr. Gwen Pounds: [phone]  - Dra. Roseanne Reno: [phone]  - Dr. Katrinka Blazing: [phone]   En caso de inclemencias del tiempo, por favor llame a Lacy Duverney principal al [phone] para una actualizacin sobre el [place] de cualquier retraso o cierre.  Consejos para la medicacin en dermatologa: Por favor, guarde las cajas en las que vienen los medicamentos de uso tpico para ayudarle a seguir las instrucciones sobre dnde y cmo usarlos. Las farmacias generalmente imprimen las instrucciones del medicamento slo en las cajas y no directamente en los tubos del [place].   Si su medicamento es muy caro, por favor, pngase en contacto con Rolm Gala llamando al [phone] y presione la opcin 4 o envenos un mensaje a travs de Clinical cytogeneticist.   No podemos decirle cul ser su copago por los medicamentos por adelantado ya que esto es diferente dependiendo de la cobertura de su seguro. Sin embargo, es posible que podamos encontrar un medicamento sustituto a Audiological scientist un formulario para que el seguro cubra el medicamento que se considera necesario.   Si se requiere una autorizacin previa para que su compaa de seguros [place] su medicamento, por favor permtanos de 1 a 2 das hbiles para completar [address]  Los precios de los medicamentos varan con frecuencia dependiendo del Environmental consultant de dnde se surte la receta y alguna farmacias pueden ofrecer precios ms baratos.  El sitio web www.goodrx.com tiene cupones para medicamentos de Health and safety inspector. Los precios aqu no tienen en cuenta lo que podra costar con la ayuda del seguro (puede ser ms barato con su seguro), pero el sitio web puede darle el precio si no utiliz Tourist information centre manager.  - Puede imprimir el cupn correspondiente y llevarlo con su receta a la farmacia.  - Tambin puede pasar por nuestra oficina durante el horario de atencin regular y Education officer, museum una tarjeta de cupones de GoodRx.  - Si necesita que su receta se  enve electrnicamente a una farmacia diferente, informe a nuestra oficina a travs de MyChart de [place] o por telfono llamando al [phone] y presione la opcin 4.

## 2023-03-13 NOTE — Addendum Note (Signed)
Addended by: Lawson Radar on: 03/13/2023 03:39 PM   Modules accepted: Orders

## 2023-04-09 ENCOUNTER — Encounter: Payer: Self-pay | Admitting: Dermatology

## 2023-04-09 ENCOUNTER — Ambulatory Visit (INDEPENDENT_AMBULATORY_CARE_PROVIDER_SITE_OTHER): Payer: Medicare Other | Admitting: Dermatology

## 2023-04-09 VITALS — BP 142/67 | HR 73

## 2023-04-09 DIAGNOSIS — Z85828 Personal history of other malignant neoplasm of skin: Secondary | ICD-10-CM

## 2023-04-09 DIAGNOSIS — C44722 Squamous cell carcinoma of skin of right lower limb, including hip: Secondary | ICD-10-CM

## 2023-04-09 DIAGNOSIS — Z85820 Personal history of malignant melanoma of skin: Secondary | ICD-10-CM | POA: Diagnosis not present

## 2023-04-09 DIAGNOSIS — D485 Neoplasm of uncertain behavior of skin: Secondary | ICD-10-CM

## 2023-04-09 NOTE — Patient Instructions (Addendum)
[facility] Dermatology Mohs with Dr. Lorn Junes or Dr. Sallee Lange  Wound Care Instructions  Cleanse wound gently with soap and water once a day then pat dry with clean gauze. Apply a thin coat of Petrolatum (petroleum jelly, "Vaseline") over the wound (unless you have an allergy to this). We recommend that you use a new, sterile tube of Vaseline. Do not pick or remove scabs. Do not remove the yellow or white "healing tissue" from the base of the wound.  Cover the wound with fresh, clean, nonstick gauze and secure with paper tape. You may use Band-Aids in place of gauze and tape if the wound is small enough, but would recommend trimming much of the tape off as there is often too much. Sometimes Band-Aids can irritate the skin.  You should call the office for your biopsy report after 1 week if you have not already been contacted.  If you experience any problems, such as abnormal amounts of bleeding, swelling, significant bruising, significant pain, or evidence of infection, please call the office immediately.  FOR ADULT SURGERY PATIENTS: If you need something for pain relief you may take 1 extra strength Tylenol (acetaminophen) AND 2 Ibuprofen (200mg  each) together every 4 hours as needed for pain. (do not take these if you are allergic to them or if you have a reason you should not take them.) Typically, you may only need pain medication for 1 to 3 days.     Due to recent changes in healthcare laws, you may see results of your pathology and/or laboratory studies on MyChart before the doctors have had a chance to review them. We understand that in some cases there may be results that are confusing or concerning to you. Please understand that not all results are received at the same time and often the doctors may need to interpret multiple results in order to provide you with the best plan of care or course of treatment. Therefore, we ask that you please give [place] 2 business days to thoroughly review all your results  before contacting the office for clarification. Should we see a critical lab result, you will be contacted sooner.   If You Need Anything After Your Visit  If you have any questions or concerns for your doctor, please call our main line at [phone] and press option 4 to reach your doctor's medical assistant. If no one answers, please leave a voicemail as directed and we will return your call as soon as possible. Messages left after 4 pm will be answered the following business day.   You may also send [place] a message via MyChart. We typically respond to MyChart messages within 1-2 business days.  For prescription refills, please ask your pharmacy to contact our office. Our fax number is [phone].  If you have an urgent issue when the clinic is closed that cannot wait until the next business day, you can page your doctor at the number below.    Please note that while we do our best to be available for urgent issues outside of office hours, we are not available 24/7.   If you have an urgent issue and are unable to reach [place], you may choose to seek medical care at your doctor's office, retail clinic, urgent care center, or emergency room.  If you have a medical emergency, please immediately call 911 or go to the emergency department.  Pager Numbers  - Dr. Gwen Pounds: [phone]  - Dr. Roseanne Reno: [phone]  - Dr. Katrinka Blazing: [phone]   In  the event of inclement weather, please call our main line at [phone] for an update on the status of any delays or closures.  Dermatology Medication Tips: Please keep the boxes that topical medications come in in order to help keep track of the instructions about where and how to use these. Pharmacies typically print the medication instructions only on the boxes and not directly on the medication tubes.   If your medication is too expensive, please contact our office at [phone] option 4 or send [place] a message through MyChart.   We are unable  to tell what your co-pay for medications will be in advance as this is different depending on your insurance coverage. However, we may be able to find a substitute medication at lower cost or fill out paperwork to get insurance to cover a needed medication.   If a prior authorization is required to get your medication covered by your insurance company, please allow [place] 1-2 business days to complete this process.  Drug prices often vary depending on where the prescription is filled and some pharmacies may offer cheaper prices.  The website www.goodrx.com contains coupons for medications through different pharmacies. The prices here do not account for what the cost may be with help from insurance (it may be cheaper with your insurance), but the website can give you the price if you did not use any insurance.  - You can print the associated coupon and take it with your prescription to the pharmacy.  - You may also stop by our office during regular business hours and pick up a GoodRx coupon card.  - If you need your prescription sent electronically to a different pharmacy, notify our office through [facility] or by phone at [phone] option 4.     Si Usted Necesita Algo Despus de Su Visita  Tambin puede enviarnos un mensaje a travs de Clinical cytogeneticist. Por lo general respondemos a los mensajes de MyChart en el transcurso de 1 a 2 das hbiles.  Para renovar recetas, por favor pida a su farmacia que se ponga en contacto con nuestra oficina. Annie Sable de fax es [place] [phone].  Si tiene un asunto urgente cuando la clnica est cerrada y que no puede esperar hasta el siguiente da hbil, puede llamar/localizar a su doctor(a) al nmero que aparece a continuacin.   Por favor, tenga en cuenta que aunque hacemos todo lo posible para estar disponibles para asuntos urgentes fuera del horario de [place], no estamos disponibles las 24 horas del da, los 7 [address]  [address] de la [place].   Si tiene un  problema urgente y no puede comunicarse con nosotros, puede optar por buscar atencin mdica  en el consultorio de su doctor(a), en una clnica privada, en un centro de atencin urgente o en una sala de emergencias.  Si tiene Engineer, drilling, por favor llame inmediatamente al 911 o vaya a la sala de emergencias.  Nmeros de bper  - Dr. Gwen Pounds: [phone]  - Dra. Roseanne Reno: [phone]  - Dr. Katrinka Blazing: [phone]   En caso de inclemencias del tiempo, por favor llame a Lacy Duverney principal al [phone] para una actualizacin sobre el [place] de cualquier retraso o cierre.  Consejos para la medicacin en dermatologa: Por favor, guarde las cajas en las que vienen los medicamentos de uso tpico para ayudarle a seguir las instrucciones sobre dnde y cmo usarlos. Las farmacias generalmente imprimen las instrucciones del medicamento slo en las cajas y no directamente en los tubos del [place].  Si su medicamento es muy caro, por favor, pngase en contacto con Rolm Gala llamando al [phone] y presione la opcin 4 o envenos un mensaje a travs de Clinical cytogeneticist.   No podemos decirle cul ser su copago por los medicamentos por adelantado ya que esto es diferente dependiendo de la cobertura de su seguro. Sin embargo, es posible que podamos encontrar un medicamento sustituto a Audiological scientist un formulario para que el seguro cubra el medicamento que se considera necesario.   Si se requiere una autorizacin previa para que su compaa de seguros [place] su medicamento, por favor permtanos de 1 a 2 das hbiles para completar [address]  Los precios de los medicamentos varan con frecuencia dependiendo del Environmental consultant de dnde se surte la receta y alguna farmacias pueden ofrecer precios ms baratos.  El sitio web www.goodrx.com tiene cupones para medicamentos de Health and safety inspector. Los precios aqu no tienen en cuenta lo que podra costar con la ayuda del seguro (puede ser ms  barato con su seguro), pero el sitio web puede darle el precio si no utiliz Tourist information centre manager.  - Puede imprimir el cupn correspondiente y llevarlo con su receta a la farmacia.  - Tambin puede pasar por nuestra oficina durante el horario de atencin regular y Education officer, museum una tarjeta de cupones de GoodRx.  - Si necesita que su receta se enve electrnicamente a una farmacia diferente, informe a nuestra oficina a travs de MyChart de [place] o por telfono llamando al [phone] y presione la opcin 4.

## 2023-04-09 NOTE — Progress Notes (Signed)
   Follow-Up Visit   Subjective  Lori Dalton is a 82 y.o. female who presents for the following: sore spot at right lower leg, present for only a couple of weeks. Pt with hx of SCC, melanoma.   The patient has spots, moles and lesions to be evaluated, some may be new or changing and the patient may have concern these could be cancer.   The following portions of the chart were reviewed this encounter and updated as appropriate: medications, allergies, medical history  Review of Systems:  No other skin or systemic complaints except as noted in HPI or Assessment and Plan.  Objective  Well appearing patient in no apparent distress; mood and affect are within normal limits.   A focused examination was performed of the following areas: Right leg  Relevant exam findings are noted in the Assessment and Plan.  right lower lateral leg 0.8 cm keratotic papule R/o SCC       Assessment & Plan     Neoplasm of uncertain behavior of skin right lower lateral leg  Skin / nail biopsy Type of biopsy: tangential   Informed consent: discussed and consent obtained   Timeout: patient name, date of birth, surgical site, and procedure verified   Procedure prep:  Patient was prepped and draped in usual sterile fashion Prep type:  Isopropyl alcohol Anesthesia: the lesion was anesthetized in a standard fashion   Anesthetic:  1% lidocaine w/ epinephrine 1-100,000 buffered w/ 8.4% NaHCO3 Instrument used: DermaBlade   Hemostasis achieved with: pressure, aluminum chloride and electrodesiccation   Outcome: patient tolerated procedure well   Post-procedure details: sterile dressing applied and wound care instructions given   Dressing type: bandage and petrolatum    Specimen 1 - Surgical pathology Differential Diagnosis: R/o SCC  Check Margins: No 0.8 cm keratotic papule   Will plan Mohs. Patient  prefers [facility].    Return for as scheduled.  Anise Salvo, RMA, am acting as scribe for  Elie Goody, MD .   Documentation: I have reviewed the above documentation for accuracy and completeness, and I agree with the above.  Elie Goody, MD

## 2023-04-11 LAB — SURGICAL PATHOLOGY

## 2023-04-14 ENCOUNTER — Telehealth: Payer: Self-pay

## 2023-04-14 NOTE — Telephone Encounter (Signed)
Patient has been advised of BX results and referral sent to [facility]. aw

## 2023-04-14 NOTE — Telephone Encounter (Signed)
-----   Message from [place] sent at 04/11/2023  8:50 PM EDT ----- Diagnosis: WELL DIFFERENTIATED SQUAMOUS CELL CARCINOMA  Please call with diagnosis and determine where the patient would like to have Mohs surgery.  Explanation: This is a squamous cell skin cancer that has grown beyond the surface of the skin and is invading the second layer of the skin. It has the potential to spread beyond the skin and threaten your health, so I recommend treating it.  Treatment: Given the location and type of skin cancer, I recommend Mohs surgery. Mohs surgery involves cutting out the skin cancer and then checking under the microscope to ensure the whole skin cancer was removed. If any skin cancer remains, the surgeon will cut out more until it is fully removed. The cure rate is about 98-99%. Once the Mohs surgeon confirms the skin cancer is out, they will discuss the options to repair or heal the area. You must take it easy for about two weeks after surgery (no lifting over 10-15 lbs, avoid activity to get your heart rate and blood pressure up). It is done at another office outside of [place] ([place], [place], or [place]).  If the patient asks for my recommendation for Mohs surgeon, I recommend Dr Caprice Beaver or Dr Coralie Carpen at [facility] if the patient is able to make the trip.

## 2023-05-08 ENCOUNTER — Telehealth: Payer: Self-pay

## 2023-05-08 NOTE — Telephone Encounter (Signed)
History and specimen tracking updated from [facility] progress notes and photos of R lateral lower leg. aw

## 2023-09-15 ENCOUNTER — Encounter: Payer: Self-pay | Admitting: Dermatology

## 2023-09-15 ENCOUNTER — Ambulatory Visit (INDEPENDENT_AMBULATORY_CARE_PROVIDER_SITE_OTHER): Payer: Medicare Other | Admitting: Dermatology

## 2023-09-15 DIAGNOSIS — D692 Other nonthrombocytopenic purpura: Secondary | ICD-10-CM

## 2023-09-15 DIAGNOSIS — W908XXA Exposure to other nonionizing radiation, initial encounter: Secondary | ICD-10-CM | POA: Diagnosis not present

## 2023-09-15 DIAGNOSIS — D229 Melanocytic nevi, unspecified: Secondary | ICD-10-CM

## 2023-09-15 DIAGNOSIS — L578 Other skin changes due to chronic exposure to nonionizing radiation: Secondary | ICD-10-CM | POA: Diagnosis not present

## 2023-09-15 DIAGNOSIS — L570 Actinic keratosis: Secondary | ICD-10-CM | POA: Diagnosis not present

## 2023-09-15 DIAGNOSIS — L409 Psoriasis, unspecified: Secondary | ICD-10-CM

## 2023-09-15 DIAGNOSIS — L814 Other melanin hyperpigmentation: Secondary | ICD-10-CM

## 2023-09-15 DIAGNOSIS — L821 Other seborrheic keratosis: Secondary | ICD-10-CM

## 2023-09-15 DIAGNOSIS — Z79899 Other long term (current) drug therapy: Secondary | ICD-10-CM

## 2023-09-15 DIAGNOSIS — T148XXA Other injury of unspecified body region, initial encounter: Secondary | ICD-10-CM

## 2023-09-15 DIAGNOSIS — D1801 Hemangioma of skin and subcutaneous tissue: Secondary | ICD-10-CM

## 2023-09-15 DIAGNOSIS — L209 Atopic dermatitis, unspecified: Secondary | ICD-10-CM

## 2023-09-15 DIAGNOSIS — L905 Scar conditions and fibrosis of skin: Secondary | ICD-10-CM

## 2023-09-15 DIAGNOSIS — Z7189 Other specified counseling: Secondary | ICD-10-CM

## 2023-09-15 DIAGNOSIS — Z1283 Encounter for screening for malignant neoplasm of skin: Secondary | ICD-10-CM | POA: Diagnosis not present

## 2023-09-15 DIAGNOSIS — S80811A Abrasion, right lower leg, initial encounter: Secondary | ICD-10-CM

## 2023-09-15 MED ORDER — MOMETASONE FUROATE 0.1 % EX CREA: TOPICAL_CREAM | Freq: Every day | CUTANEOUS | 6 refills | Status: AC

## 2023-09-15 NOTE — Progress Notes (Unsigned)
 Follow-Up Visit   Subjective  Lori Dalton is a 83 y.o. female who presents for the following: Skin Cancer Screening and Full Body Skin Exam, Hx MelanomaIS, SCC, SCCIS, spot of concern L lower leg noticed it in January, was red in the beginning but has improved now.    The patient presents for Total-Body Skin Exam (TBSE) for skin cancer screening and mole check. The patient has spots, moles and lesions to be evaluated, some may be new or changing and the patient may have concern these could be cancer.   The following portions of the chart were reviewed this encounter and updated as appropriate: medications, allergies, medical history  Review of Systems:  No other skin or systemic complaints except as noted in HPI or Assessment and Plan.  Objective  Well appearing patient in no apparent distress; mood and affect are within normal limits.  A full examination was performed including scalp, head, eyes, ears, nose, lips, neck, chest, axillae, abdomen, back, buttocks, bilateral upper extremities, bilateral lower extremities, hands, feet, fingers, toes, fingernails, and toenails. All findings within normal limits unless otherwise noted below.   Relevant physical exam findings are noted in the Assessment and Plan.  R forearm x1 Pink scaly macules  Assessment & Plan   SKIN CANCER SCREENING PERFORMED TODAY.  ACTINIC DAMAGE - Chronic condition, secondary to cumulative UV/sun exposure - diffuse scaly erythematous macules with underlying dyspigmentation - Recommend daily broad spectrum sunscreen SPF 30+ to sun-exposed areas, reapply every 2 hours as needed.  - Staying in the shade or wearing long sleeves, sun glasses (UVA+UVB protection) and wide brim hats (4-inch brim around the entire circumference of the hat) are also recommended for sun protection.  - Call for new or changing lesions.  LENTIGINES, SEBORRHEIC KERATOSES, HEMANGIOMAS - Benign normal skin lesions - Benign-appearing - Call  for any changes  MELANOCYTIC NEVI - Tan-brown and/or pink-flesh-colored symmetric macules and papules - Benign appearing on exam today - Observation - Call clinic for new or changing moles - Recommend daily use of broad spectrum spf 30+ sunscreen to sun-exposed areas.   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 Excised: 09/20/2014, margins free  - No evidence of recurrence today - No lymphadenopathy  - Recommend regular full body skin exams - Recommend daily broad spectrum sunscreen SPF 30+ to sun-exposed areas, reapply every 2 hours as needed.  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 - No evidence of recurrence today - No lymphadenopathy - Recommend regular full body skin exams - Recommend daily broad spectrum sunscreen SPF 30+ to sun-exposed areas, reapply every 2 hours as needed.  - Call if any new or changing lesions are noted between office visits   History of Squamous Cell Carcinoma in Situ of the Skin - No evidence of recurrence today - Recommend regular full body skin exams - Recommend daily broad spectrum sunscreen SPF 30+ to sun-exposed areas, reapply every 2 hours as needed.  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 < 1% BSA.   Chronic and persistent condition with duration or expected duration over one year. Condition is symptomatic bothersome to patient. Not currently at goal.  Treatment plan: Will need quant gold in June 2025 - we will  contact patient when time comes.  continue Skyrizi 150 mg every 12 weeks   ATOPIC DERMATIS Exam: Scaly pink papules coalescing to plaques at bilateral AC fossa, b/l flanks, L lower back 5% BSA  Chronic and persistent condition with duration or expected duration over  one year. Condition is bothersome/symptomatic for patient. Currently flared.   Atopic dermatitis (eczema) is a chronic, relapsing, pruritic condition that can significantly affect quality of life. It is often associated with allergic rhinitis and/or asthma and can require treatment with topical medications, phototherapy, or in severe cases biologic injectable medication (Dupixent; Adbry) or Oral JAK inhibitors.  Treatment Plan: Use mometasone 0.1% cream BID to aa, until itching resolves.   Recommend gentle skin care.    Purpura - Chronic; persistent and recurrent.  Treatable, but not curable. - Violaceous macules and patches - Benign - Related to trauma, age, sun damage and/or use of blood thinners, chronic use of topical and/or oral steroids - Observe - Can use OTC arnica containing moisturizer such as Dermend Bruise Formula if desired - Call for worsening or other concerns    Excoriation Exam: at R lower leg secondary to shaving  Treatment Plan: Benign-appearing.  Observation.  Call clinic for new or changing lesions.   SCAR from [facility] surgery at R lower leg Exam: well-healed scar Took photo in Epic and sent Dr Lorn Junes a message  AK (ACTINIC KERATOSIS) R forearm x1 Actinic keratoses are precancerous spots that appear secondary to cumulative UV radiation exposure/sun exposure over time. They are chronic with expected duration over 1 year. A portion of actinic keratoses will progress to squamous cell carcinoma of the skin. It is not possible to reliably predict which spots will progress to skin cancer and so treatment is recommended to prevent development of skin cancer.  Recommend daily broad spectrum sunscreen SPF 30+ to sun-exposed areas, reapply every 2 hours as needed.  Recommend staying in the shade or wearing long sleeves, sun glasses (UVA+UVB protection) and wide brim hats (4-inch brim around the entire circumference of the hat). Call for new or changing lesions.   Aks  at face not treated today, will monitor.  Destruction of lesion - R forearm x1 Complexity: simple   Destruction method: cryotherapy   Informed consent: discussed and consent obtained   Timeout:  patient name, date of birth, surgical site, and procedure verified Lesion destroyed using liquid nitrogen: Yes   Region frozen until ice ball extended beyond lesion: Yes   Cryo cycles: 1 or 2. Outcome: patient tolerated procedure well with no complications   Post-procedure details: wound care instructions given   MULTIPLE BENIGN NEVI   LENTIGINES   ACTINIC ELASTOSIS   SEBORRHEIC KERATOSES   CHERRY ANGIOMA   PSORIASIS   Related Medications Roflumilast (ZORYVE) 0.3 % CREA Apply 1 application topically daily. Qd to aa psoriasis mometasone (ELOCON) 0.1 % lotion Apply topically daily. Use up to 5 days a week betamethasone dipropionate (DIPROLENE) 0.05 % ointment Apply topically 2 (two) times daily as needed (Rash). Apply twice daily to affected areas for 2 weeks then weekends only. Avoid applying to face, groin, and axilla. Use as directed. Long-term use can cause thinning of the skin. ATOPIC DERMATITIS, UNSPECIFIED TYPE   Related Medications mometasone (ELOCON) 0.1 % cream Apply topically daily. BID to aa, until itching resolves. SOLAR PURPURA (HCC)   EXCORIATION   COUNSELING AND COORDINATION OF CARE   LONG-TERM USE OF HIGH-RISK MEDICATION   Return in about 1 year (around 09/14/2024) for TBSE, Hx SCC,  Hx SCCIS, Melanoma IS,  w/ Dr. Katrinka Blazing, [place] follow-up.  Wynonia Lawman, CMA, am acting as scribe for Elie Goody, MD .   Documentation: I have reviewed the above documentation for accuracy and completeness, and I agree with the above.  Elie Goody, MD

## 2023-09-15 NOTE — Patient Instructions (Addendum)

## 2023-12-02 ENCOUNTER — Other Ambulatory Visit: Payer: Self-pay | Admitting: Family Medicine

## 2023-12-02 DIAGNOSIS — Z1231 Encounter for screening mammogram for malignant neoplasm of breast: Secondary | ICD-10-CM

## 2023-12-25 ENCOUNTER — Ambulatory Visit
Admission: RE | Admit: 2023-12-25 | Discharge: 2023-12-25 | Disposition: A | Discharge status: Home / Self Care | Source: Ambulatory Visit | Attending: Family Medicine | Admitting: Family Medicine

## 2023-12-25 DIAGNOSIS — Z1231 Encounter for screening mammogram for malignant neoplasm of breast: Secondary | ICD-10-CM | POA: Diagnosis present

## 2024-01-05 ENCOUNTER — Other Ambulatory Visit: Payer: Self-pay | Admitting: Dermatology

## 2024-01-05 DIAGNOSIS — Z7960 Long term (current) use of unspecified immunomodulators and immunosuppressants: Secondary | ICD-10-CM

## 2024-01-05 DIAGNOSIS — L409 Psoriasis, unspecified: Secondary | ICD-10-CM

## 2024-01-05 MED ORDER — SKYRIZI PEN 150 MG/ML ~~LOC~~ SOAJ
150.0000 mg | SUBCUTANEOUS | 3 refills | Status: DC
Start: 2024-01-05 — End: 2024-01-12

## 2024-01-12 ENCOUNTER — Other Ambulatory Visit: Payer: Self-pay

## 2024-01-12 DIAGNOSIS — L409 Psoriasis, unspecified: Secondary | ICD-10-CM

## 2024-01-12 MED ORDER — SKYRIZI PEN 150 MG/ML ~~LOC~~ SOAJ: 150.0000 mg | SUBCUTANEOUS | 3 refills | Status: DC

## 2024-01-12 NOTE — Progress Notes (Signed)
 Insurance requires patient to use [facility].

## 2024-01-13 ENCOUNTER — Other Ambulatory Visit: Payer: Self-pay | Admitting: Dermatology

## 2024-01-17 LAB — QUANTIFERON-TB GOLD PLUS
QuantiFERON Mitogen Value: 7.14 [IU]/mL
QuantiFERON Nil Value: 0.05 [IU]/mL
QuantiFERON TB1 Ag Value: 0.05 [IU]/mL
QuantiFERON TB2 Ag Value: 0.06 [IU]/mL
QuantiFERON-TB Gold Plus: NEGATIVE

## 2024-01-19 ENCOUNTER — Ambulatory Visit: Payer: Self-pay | Admitting: Dermatology

## 2024-01-20 NOTE — Telephone Encounter (Signed)
 Patient advised today. aw

## 2024-02-09 ENCOUNTER — Other Ambulatory Visit: Payer: Self-pay

## 2024-02-09 ENCOUNTER — Telehealth: Payer: Self-pay

## 2024-02-09 DIAGNOSIS — L409 Psoriasis, unspecified: Secondary | ICD-10-CM

## 2024-02-09 MED ORDER — SKYRIZI PEN 150 MG/ML ~~LOC~~ SOAJ: 150.0000 mg | SUBCUTANEOUS | 3 refills | Status: DC

## 2024-02-09 NOTE — Telephone Encounter (Signed)
 Skyrizi  sent to [facility] pharmacy

## 2024-02-09 NOTE — Telephone Encounter (Signed)
 Error

## 2024-02-09 NOTE — Telephone Encounter (Signed)
 Patient called requesting a rx for Skyrizi  be sent to her pharmacy.   Okay Skyrizi  rx sent to

## 2024-02-11 ENCOUNTER — Other Ambulatory Visit: Payer: Self-pay

## 2024-02-11 DIAGNOSIS — L409 Psoriasis, unspecified: Secondary | ICD-10-CM

## 2024-02-11 MED ORDER — SKYRIZI PEN 150 MG/ML ~~LOC~~ SOAJ: 150.0000 mg | SUBCUTANEOUS | 2 refills | Status: AC

## 2024-02-11 NOTE — Progress Notes (Signed)
 RX transfer to [facility]. aw

## 2024-05-24 ENCOUNTER — Telehealth: Payer: Self-pay

## 2024-05-24 NOTE — Telephone Encounter (Signed)
 Patient called for appointment with Dr. Claudene for painful lesion on right leg. I offered for her to see a different doctor but she prefers to see Dr. Claudene. I scheduled her for 06/15/24 and advised she could call daily mid morning to check for cancellations.

## 2024-05-27 ENCOUNTER — Ambulatory Visit: Admitting: Dermatology

## 2024-05-27 ENCOUNTER — Encounter: Payer: Self-pay | Admitting: Dermatology

## 2024-05-27 DIAGNOSIS — C44722 Squamous cell carcinoma of skin of right lower limb, including hip: Secondary | ICD-10-CM | POA: Diagnosis not present

## 2024-05-27 DIAGNOSIS — D492 Neoplasm of unspecified behavior of bone, soft tissue, and skin: Secondary | ICD-10-CM | POA: Diagnosis not present

## 2024-05-27 NOTE — Patient Instructions (Signed)

## 2024-05-27 NOTE — Progress Notes (Signed)
   Follow-Up Visit   Subjective  Lori Dalton is a 83 y.o. female who presents for the following: Spot on right leg. Painful. Injury to area a few times.  Dur: few weeks.   The patient has spots, moles and lesions to be evaluated, some may be new or changing and the patient may have concern these could be cancer.    The following portions of the chart were reviewed this encounter and updated as appropriate: medications, allergies, medical history  Review of Systems:  No other skin or systemic complaints except as noted in HPI or Assessment and Plan.  Objective  Well appearing patient in no apparent distress; mood and affect are within normal limits.  A focused examination was performed of the following areas: Right lower leg, anterior   Relevant physical exam findings are noted in the Assessment and Plan.  Right Lower Leg - Anterior 1.3 cm pink plaque   Assessment & Plan   NEOPLASM OF SKIN Right Lower Leg - Anterior Skin / nail biopsy Type of biopsy: tangential   Informed consent: discussed and consent obtained   Timeout: patient name, date of birth, surgical site, and procedure verified   Procedure prep:  Patient was prepped and draped in usual sterile fashion Prep type:  Isopropyl alcohol Anesthesia: the lesion was anesthetized in a standard fashion   Anesthetic:  1% lidocaine w/ epinephrine 1-100,000 buffered w/ 8.4% NaHCO3 Instrument used: DermaBlade   Hemostasis achieved with: pressure and aluminum chloride   Outcome: patient tolerated procedure well   Post-procedure details: sterile dressing applied and wound care instructions given   Dressing type: bandage and petrolatum    Specimen 1 - Surgical pathology Differential Diagnosis: SCC  Check Margins: No  Recommend Mohs if invasive SCC. Patient may prefer [facility] and understands risk of recurrence with [facility]   Return for TBSE As Scheduled.  I, Jill Parcell, CMA, am acting as scribe for Boneta Sharps,  MD.   Documentation: I have reviewed the above documentation for accuracy and completeness, and I agree with the above.  Boneta Sharps, MD

## 2024-05-31 LAB — SURGICAL PATHOLOGY

## 2024-06-01 ENCOUNTER — Ambulatory Visit: Payer: Self-pay | Admitting: Dermatology

## 2024-06-01 DIAGNOSIS — C4492 Squamous cell carcinoma of skin, unspecified: Secondary | ICD-10-CM

## 2024-06-01 NOTE — Telephone Encounter (Signed)
-----   Message from [place] sent at 06/01/2024  8:36 AM EST ----- right lower leg - anterior :       WELL DIFFERENTIATED SQUAMOUS CELL CARCINOMA   Please call to share biopsy shows invasive SCC and refer to Mohs. Thank you ----- Message ----- From: Interface, [facility] Sent: 05/31/2024   5:16 PM EST To: Boneta Sharps, MD

## 2024-06-01 NOTE — Telephone Encounter (Signed)
 Discussed pathology results and treatment plan. Patient voiced understanding. Referral for Mohs sent to Dr. Gregorio.

## 2024-06-15 ENCOUNTER — Ambulatory Visit: Admitting: Dermatology

## 2024-08-10 ENCOUNTER — Telehealth: Payer: Self-pay

## 2024-08-10 NOTE — Telephone Encounter (Signed)
 Patient is still trying to complete the full [facility] Patient Assistance packet for 2026 year. Additional information is needed and patient bringing to me next week. Patient is due this week and one sample given.   LOT: [id number] EXP: 11/19/2024

## 2024-09-14 ENCOUNTER — Ambulatory Visit: Payer: Medicare Other | Admitting: Dermatology
